# Patient Record
Sex: Male | Born: 1970 | Race: Black or African American | Hispanic: No | Smoking: Current every day smoker
Health system: Southern US, Community
[De-identification: ages and names within clinical notes are randomized; demographics above are authoritative.]

## PROBLEM LIST (undated history)

## (undated) DIAGNOSIS — I82409 Acute embolism and thrombosis of unspecified deep veins of unspecified lower extremity: Secondary | ICD-10-CM

---

## 2012-11-23 ENCOUNTER — Emergency Department (HOSPITAL_COMMUNITY)
Admission: EM | Admit: 2012-11-23 | Discharge: 2012-11-23 | Disposition: A | Discharge status: Home / Self Care | Payer: Self-pay | Attending: Emergency Medicine | Admitting: Emergency Medicine

## 2012-11-23 ENCOUNTER — Encounter (HOSPITAL_COMMUNITY): Payer: Self-pay | Admitting: *Deleted

## 2012-11-23 DIAGNOSIS — R509 Fever, unspecified: Secondary | ICD-10-CM | POA: Insufficient documentation

## 2012-11-23 DIAGNOSIS — IMO0001 Reserved for inherently not codable concepts without codable children: Secondary | ICD-10-CM | POA: Insufficient documentation

## 2012-11-23 DIAGNOSIS — F17200 Nicotine dependence, unspecified, uncomplicated: Secondary | ICD-10-CM | POA: Insufficient documentation

## 2012-11-23 DIAGNOSIS — R6889 Other general symptoms and signs: Secondary | ICD-10-CM

## 2012-11-23 MED ORDER — IBUPROFEN 800 MG PO TABS
800.0000 mg | ORAL_TABLET | Freq: Once | ORAL | Status: AC
  Administered 2012-11-23: 800 mg via ORAL
  Filled 2012-11-23: qty 1

## 2012-11-23 NOTE — ED Notes (Signed)
Per EMS: pt has flu like symptoms. Pt states that his symptoms started a few days ago with chills, body aches and pains. Pt unable to find ride to get tylenol, so pt has been unable to treat symptoms.

## 2012-11-24 NOTE — ED Provider Notes (Signed)
History     CSN: [id number]  Arrival date & time 11/23/12  2216   First MD Initiated Contact with Patient 11/23/12 2302      Chief Complaint  Patient presents with  . flu like symptoms     (Consider location/radiation/quality/duration/timing/severity/associated sxs/prior treatment) HPI 42 year old male presents to emergency room complaining of fever, chills, body aches and pain. Patient told triage symptoms started several days ago, reports to me they started earlier today. No known sick contacts, patient is a smoker no wheezing. He reports he got a flu shot 6 months ago, but his girlfriend denies this. He has not taken anything for it thus far. No nausea no vomiting no diarrhea.  History reviewed. No pertinent past medical history.  History reviewed. No pertinent past surgical history.  History reviewed. No pertinent family history.  History  Substance Use Topics  . Smoking status: Current Every Day Smoker  . Smokeless tobacco: Not on file  . Alcohol Use: No      Review of Systems  See History of Present Illness; otherwise all other systems are reviewed and negative  Allergies  Bee venom  Home Medications   Current Outpatient Rx  Name  Route  Sig  Dispense  Refill  . OVER THE COUNTER MEDICATION   Oral   Take 1 tablet by mouth every 4 (four) hours as needed. Cough and congestion (store brand cough and congestion medication)           BP 119/78  Pulse 118  Temp 100.4 F (38 C) (Oral)  Resp 20  SpO2 94%  Physical Exam  Nursing note and vitals reviewed. Constitutional: He is oriented to person, place, and time. He appears well-developed and well-nourished. He appears distressed (uncomfortable appearing, ).  HENT:  Head: Normocephalic and atraumatic.  Right Ear: External ear normal.  Left Ear: External ear normal.  Nose: Nose normal.  Mouth/Throat: Oropharynx is clear and moist.  Eyes: Conjunctivae normal and EOM are normal. Pupils are equal, round, and  reactive to light.  Neck: Normal range of motion. Neck supple. No JVD present. No tracheal deviation present. No thyromegaly present.  Cardiovascular: Regular rhythm, normal heart sounds and intact distal pulses.  Exam reveals no gallop and no friction rub.   No murmur heard.      Tachycardia noted  Pulmonary/Chest: Effort normal and breath sounds normal. No stridor. No respiratory distress. He has no wheezes. He has no rales. He exhibits no tenderness.  Abdominal: Soft. Bowel sounds are normal. He exhibits no distension and no mass. There is no tenderness. There is no rebound and no guarding.  Musculoskeletal: Normal range of motion. He exhibits no edema and no tenderness.  Lymphadenopathy:    He has no cervical adenopathy.  Neurological: He is alert and oriented to person, place, and time. He has normal reflexes. No cranial nerve deficit. He exhibits normal muscle tone. Coordination normal.  Skin: Skin is warm and dry. No rash noted. No erythema. No pallor.  Psychiatric: He has a normal mood and affect. His behavior is normal. Judgment and thought content normal.    ED Course  Procedures (including critical care time)  Labs Reviewed - No data to display No results found.   1. Flu-like symptoms       MDM  42 year old male with flulike illness. Suspect influenza. Explained to patient symptomatic care with Tylenol and Motrin fluids and rest as best treatment. Recommended flu shot next year.        Marian Sorrow  Kellie Simmering, MD 11/24/12 [phone]

## 2020-04-19 ENCOUNTER — Other Ambulatory Visit: Payer: Self-pay

## 2020-04-19 ENCOUNTER — Emergency Department (HOSPITAL_COMMUNITY): Payer: Self-pay

## 2020-04-19 ENCOUNTER — Encounter (HOSPITAL_COMMUNITY): Payer: Self-pay

## 2020-04-19 ENCOUNTER — Inpatient Hospital Stay (HOSPITAL_COMMUNITY)
Admission: EM | Admit: 2020-04-19 | Discharge: 2020-04-24 | DRG: 176 | Disposition: A | Discharge status: Home / Self Care | Payer: Self-pay | Attending: Internal Medicine | Admitting: Internal Medicine

## 2020-04-19 DIAGNOSIS — Z20822 Contact with and (suspected) exposure to covid-19: Secondary | ICD-10-CM | POA: Diagnosis present

## 2020-04-19 DIAGNOSIS — Z833 Family history of diabetes mellitus: Secondary | ICD-10-CM

## 2020-04-19 DIAGNOSIS — I82433 Acute embolism and thrombosis of popliteal vein, bilateral: Secondary | ICD-10-CM | POA: Diagnosis present

## 2020-04-19 DIAGNOSIS — F17200 Nicotine dependence, unspecified, uncomplicated: Secondary | ICD-10-CM | POA: Diagnosis present

## 2020-04-19 DIAGNOSIS — I82413 Acute embolism and thrombosis of femoral vein, bilateral: Secondary | ICD-10-CM | POA: Diagnosis present

## 2020-04-19 DIAGNOSIS — J45909 Unspecified asthma, uncomplicated: Secondary | ICD-10-CM

## 2020-04-19 DIAGNOSIS — Z79899 Other long term (current) drug therapy: Secondary | ICD-10-CM

## 2020-04-19 DIAGNOSIS — I2699 Other pulmonary embolism without acute cor pulmonale: Principal | ICD-10-CM

## 2020-04-19 DIAGNOSIS — I82463 Acute embolism and thrombosis of calf muscular vein, bilateral: Secondary | ICD-10-CM | POA: Diagnosis present

## 2020-04-19 DIAGNOSIS — I2602 Saddle embolus of pulmonary artery with acute cor pulmonale: Secondary | ICD-10-CM

## 2020-04-19 DIAGNOSIS — I272 Pulmonary hypertension, unspecified: Secondary | ICD-10-CM | POA: Diagnosis present

## 2020-04-19 DIAGNOSIS — I2609 Other pulmonary embolism with acute cor pulmonale: Secondary | ICD-10-CM

## 2020-04-19 LAB — CBC WITH DIFFERENTIAL/PLATELET
Abs Immature Granulocytes: 0.07 10*3/uL (ref 0.00–0.07)
Basophils Absolute: 0.1 10*3/uL (ref 0.0–0.1)
Basophils Relative: 1 %
Eosinophils Absolute: 0.3 10*3/uL (ref 0.0–0.5)
Eosinophils Relative: 3 %
HCT: 48 % (ref 39.0–52.0)
Hemoglobin: 15.5 g/dL (ref 13.0–17.0)
Immature Granulocytes: 1 %
Lymphocytes Relative: 16 %
Lymphs Abs: 2 10*3/uL (ref 0.7–4.0)
MCH: 28.7 pg (ref 26.0–34.0)
MCHC: 32.3 g/dL (ref 30.0–36.0)
MCV: 88.9 fL (ref 80.0–100.0)
Monocytes Absolute: 1.1 10*3/uL — ABNORMAL HIGH (ref 0.1–1.0)
Monocytes Relative: 8 %
Neutro Abs: 9.2 10*3/uL — ABNORMAL HIGH (ref 1.7–7.7)
Neutrophils Relative %: 71 %
Platelets: 229 10*3/uL (ref 150–400)
RBC: 5.4 MIL/uL (ref 4.22–5.81)
RDW: 15.1 % (ref 11.5–15.5)
WBC: 12.7 10*3/uL — ABNORMAL HIGH (ref 4.0–10.5)
nRBC: 0 % (ref 0.0–0.2)

## 2020-04-19 LAB — URINALYSIS, ROUTINE W REFLEX MICROSCOPIC
Bacteria, UA: NONE SEEN
Bilirubin Urine: NEGATIVE
Glucose, UA: NEGATIVE mg/dL
Ketones, ur: 20 mg/dL — AB
Leukocytes,Ua: NEGATIVE
Nitrite: NEGATIVE
Protein, ur: NEGATIVE mg/dL
Specific Gravity, Urine: >1.046 — ABNORMAL HIGH (ref 1.005–1.030)
pH: 5 (ref 5.0–8.0)

## 2020-04-19 LAB — COMPREHENSIVE METABOLIC PANEL
ALT: 12 U/L (ref 0–44)
AST: 16 U/L (ref 15–41)
Albumin: 3.3 g/dL — ABNORMAL LOW (ref 3.5–5.0)
Alkaline Phosphatase: 53 U/L (ref 38–126)
Anion gap: 8 (ref 5–15)
BUN: 11 mg/dL (ref 6–20)
CO2: 27 mmol/L (ref 22–32)
Calcium: 9.2 mg/dL (ref 8.9–10.3)
Chloride: 98 mmol/L (ref 98–111)
Creatinine, Ser: 0.97 mg/dL (ref 0.61–1.24)
GFR calc Af Amer: >60 mL/min (ref >60)
GFR calc non Af Amer: >60 mL/min (ref >60)
Glucose, Bld: 109 mg/dL — ABNORMAL HIGH (ref 70–99)
Potassium: 3.6 mmol/L (ref 3.5–5.1)
Sodium: 133 mmol/L — ABNORMAL LOW (ref 135–145)
Total Bilirubin: 0.6 mg/dL (ref 0.3–1.2)
Total Protein: 8.5 g/dL — ABNORMAL HIGH (ref 6.5–8.1)

## 2020-04-19 LAB — ECHOCARDIOGRAM COMPLETE
Height: 65 in
Weight: 2480 oz

## 2020-04-19 LAB — LACTIC ACID, PLASMA: Lactic Acid, Venous: 1.1 mmol/L (ref 0.5–1.9)

## 2020-04-19 LAB — APTT: aPTT: 35 seconds (ref 24–36)

## 2020-04-19 LAB — SARS CORONAVIRUS 2 BY RT PCR (HOSPITAL ORDER, PERFORMED IN ~~LOC~~ HOSPITAL LAB): SARS Coronavirus 2: NEGATIVE

## 2020-04-19 LAB — PROTIME-INR
INR: 1.2 (ref 0.8–1.2)
Prothrombin Time: 14.5 seconds (ref 11.4–15.2)

## 2020-04-19 LAB — BRAIN NATRIURETIC PEPTIDE: B Natriuretic Peptide: 14 pg/mL (ref 0.0–100.0)

## 2020-04-19 LAB — TROPONIN I (HIGH SENSITIVITY): Troponin I (High Sensitivity): 2 ng/L (ref <18)

## 2020-04-19 MED ORDER — ONDANSETRON HCL 4 MG/2ML IJ SOLN: 4.0000 mg | Freq: Four times a day (QID) | INTRAMUSCULAR | Status: DC | PRN

## 2020-04-19 MED ORDER — ALBUTEROL SULFATE (2.5 MG/3ML) 0.083% IN NEBU: 2.5000 mg | INHALATION_SOLUTION | RESPIRATORY_TRACT | Status: DC | PRN

## 2020-04-19 MED ORDER — HEPARIN BOLUS VIA INFUSION: 4200.0000 [IU] | Freq: Once | INTRAVENOUS | Status: DC

## 2020-04-19 MED ORDER — KETOROLAC TROMETHAMINE 30 MG/ML IJ SOLN
30.0000 mg | Freq: Once | INTRAMUSCULAR | Status: DC
  Filled 2020-04-19: qty 1

## 2020-04-19 MED ORDER — MORPHINE SULFATE (PF) 4 MG/ML IV SOLN
4.0000 mg | INTRAVENOUS | Status: DC | PRN
  Administered 2020-04-19 – 2020-04-21 (×7): 4 mg via INTRAVENOUS
  Filled 2020-04-19 (×7): qty 1

## 2020-04-19 MED ORDER — ACETAMINOPHEN 325 MG PO TABS: 650.0000 mg | ORAL_TABLET | Freq: Four times a day (QID) | ORAL | Status: DC | PRN

## 2020-04-19 MED ORDER — SODIUM CHLORIDE 0.9 % IV BOLUS
1000.0000 mL | Freq: Once | INTRAVENOUS | Status: AC
  Administered 2020-04-19: 1000 mL via INTRAVENOUS

## 2020-04-19 MED ORDER — IOHEXOL 350 MG/ML SOLN
100.0000 mL | Freq: Once | INTRAVENOUS | Status: AC | PRN
  Administered 2020-04-19: 100 mL via INTRAVENOUS

## 2020-04-19 MED ORDER — POLYETHYLENE GLYCOL 3350 17 G PO PACK: 17.0000 g | PACK | Freq: Every day | ORAL | Status: DC | PRN

## 2020-04-19 MED ORDER — HEPARIN BOLUS VIA INFUSION
5000.0000 [IU] | Freq: Once | INTRAVENOUS | Status: AC
  Administered 2020-04-19: 5000 [IU] via INTRAVENOUS

## 2020-04-19 MED ORDER — KETOROLAC TROMETHAMINE 30 MG/ML IJ SOLN
30.0000 mg | Freq: Once | INTRAMUSCULAR | Status: AC
  Administered 2020-04-19: 30 mg via INTRAVENOUS

## 2020-04-19 MED ORDER — LORAZEPAM 2 MG/ML IJ SOLN
1.0000 mg | Freq: Once | INTRAMUSCULAR | Status: AC
  Administered 2020-04-19: 1 mg via INTRAVENOUS
  Filled 2020-04-19: qty 1

## 2020-04-19 MED ORDER — MORPHINE SULFATE (PF) 4 MG/ML IV SOLN
4.0000 mg | Freq: Once | INTRAVENOUS | Status: AC
  Administered 2020-04-19: 4 mg via INTRAVENOUS
  Filled 2020-04-19: qty 1

## 2020-04-19 MED ORDER — POTASSIUM CHLORIDE CRYS ER 20 MEQ PO TBCR
40.0000 meq | EXTENDED_RELEASE_TABLET | Freq: Once | ORAL | Status: AC
  Administered 2020-04-19: 40 meq via ORAL
  Filled 2020-04-19: qty 2

## 2020-04-19 MED ORDER — HEPARIN (PORCINE) 25000 UT/250ML-% IV SOLN
1550.0000 [IU]/h | INTRAVENOUS | Status: DC
  Administered 2020-04-19: 1200 [IU]/h via INTRAVENOUS
  Administered 2020-04-20: 1600 [IU]/h via INTRAVENOUS
  Administered 2020-04-20: 1250 [IU]/h via INTRAVENOUS
  Administered 2020-04-21 – 2020-04-23 (×4): 1550 [IU]/h via INTRAVENOUS
  Filled 2020-04-19 (×6): qty 250

## 2020-04-19 MED ORDER — HEPARIN (PORCINE) 25000 UT/250ML-% IV SOLN
INTRAVENOUS | Status: AC
  Filled 2020-04-19: qty 250

## 2020-04-19 MED ORDER — SODIUM CHLORIDE 0.9 % IV SOLN: INTRAVENOUS | Status: DC

## 2020-04-19 MED ORDER — PANTOPRAZOLE SODIUM 40 MG PO TBEC
40.0000 mg | DELAYED_RELEASE_TABLET | Freq: Two times a day (BID) | ORAL | Status: DC
  Administered 2020-04-19 – 2020-04-24 (×10): 40 mg via ORAL
  Filled 2020-04-19 (×10): qty 1

## 2020-04-19 MED ORDER — ONDANSETRON HCL 4 MG/2ML IJ SOLN
4.0000 mg | Freq: Once | INTRAMUSCULAR | Status: AC
  Administered 2020-04-19: 4 mg via INTRAVENOUS
  Filled 2020-04-19: qty 2

## 2020-04-19 MED ORDER — ONDANSETRON HCL 4 MG PO TABS: 4.0000 mg | ORAL_TABLET | Freq: Four times a day (QID) | ORAL | Status: DC | PRN

## 2020-04-19 MED ORDER — ENSURE ENLIVE PO LIQD
237.0000 mL | Freq: Two times a day (BID) | ORAL | Status: DC
  Administered 2020-04-20: 237 mL via ORAL

## 2020-04-19 MED ORDER — ACETAMINOPHEN 650 MG RE SUPP: 650.0000 mg | Freq: Four times a day (QID) | RECTAL | Status: DC | PRN

## 2020-04-19 NOTE — ED Provider Notes (Addendum)
[facility] EMERGENCY DEPARTMENT Provider Note   CSN: [id number] Arrival date & time: 04/19/20  1030     History Chief Complaint  Patient presents with  . Abdominal Pain    Terrence Christian is a 49 y.o. male with no known past medical history presents to emergency department today with chief complaint of progressively worsening left-sided abdominal pain x2 days.  Patient states pain is sharp and severe.  He rates pain 10 out of 10 in severity.  Patient denies any radiation of pain.  He states his pain is constant.  He has not had any medications for symptoms prior to arrival.  He denies history of similar pain. Patient tells me he had spider bite on his left ankle x 2 weeks ago and was seen at an outside hospital and treated with PO antibiotics and reports wound improved. Denies any sick contacts or known Covid exposures.  Also denies fever, chills, cough, hemoptysis, chest pain, shortness of breath, nausea, emesis, dysuria, gross hematuria, urinary frequency, diarrhea, blood in stool.  Last bowel movement was yesterday and it was normal for him.  Denies abdominal surgical history. Admits to passing flatus today.    History reviewed. No pertinent past medical history.  There are no problems to display for this patient.   History reviewed. No pertinent surgical history.     No family history on file.  Social History   Tobacco Use  . Smoking status: Current Every Day Smoker  Substance Use Topics  . Alcohol use: No  . Drug use: No    Home Medications Prior to Admission medications   Medication Sig Start Date End Date Taking? Authorizing Provider  ATIVAN 0.5 MG tablet Take 1 tablet by mouth every 6 (six) hours as needed. 03/26/20   [provider]  OVER THE COUNTER MEDICATION Take 1 tablet by mouth every 4 (four) hours as needed. Cough and congestion (store brand cough and congestion medication)    [provider]  oxyCODONE-acetaminophen (PERCOCET/ROXICET) 5-325 MG  tablet Take 1 tablet by mouth every 6 (six) hours as needed. 03/22/20   [provider]    Allergies    Bee venom  Review of Systems   Review of Systems All other systems are reviewed and are negative for acute change except as noted in the HPI.  Physical Exam Updated Vital Signs BP (!) 130/96 (BP Location: Right Arm)   Pulse (!) 103   Temp 98.2 F (36.8 C) (Oral)   Resp (!) 26   Ht 5\' 5"  (1.651 m)   Wt 70.3 kg   SpO2 96%   BMI 25.79 kg/m   Physical Exam Vitals and nursing note reviewed.  Constitutional:      General: He is in acute distress.     Appearance: He is not ill-appearing.  HENT:     Head: Normocephalic and atraumatic.     Right Ear: Tympanic membrane and external ear normal.     Left Ear: Tympanic membrane and external ear normal.     Nose: Nose normal.     Mouth/Throat:     Mouth: Mucous membranes are moist.     Pharynx: Oropharynx is clear.  Eyes:     General: No scleral icterus.       Right eye: No discharge.        Left eye: No discharge.     Extraocular Movements: Extraocular movements intact.     Conjunctiva/sclera: Conjunctivae normal.     Pupils: Pupils are equal, round, and  reactive to light.  Neck:     Vascular: No JVD.  Cardiovascular:     Rate and Rhythm: Normal rate and regular rhythm.     Pulses: Normal pulses.          Radial pulses are 2+ on the right side and 2+ on the left side.     Heart sounds: Normal heart sounds.  Pulmonary:     Comments: Lungs clear to auscultation in all fields. Symmetric chest rise. No wheezing, rales, or rhonchi. Abdominal:     General: Bowel sounds are normal.     Palpations: There is no mass.     Tenderness: There is abdominal tenderness in the left upper quadrant and left lower quadrant. There is no right CVA tenderness or left CVA tenderness.     Hernia: No hernia is present.     Comments: Abdomen is soft, non distended. No rigidity, no guarding. No peritoneal signs.  Musculoskeletal:         General: Normal range of motion.     Cervical back: Normal range of motion.  Skin:    General: Skin is warm and dry.     Capillary Refill: Capillary refill takes less than 2 seconds.  Neurological:     Mental Status: He is oriented to person, place, and time.     GCS: GCS eye subscore is 4. GCS verbal subscore is 5. GCS motor subscore is 6.     Comments: Fluent speech, no facial droop.  Psychiatric:        Behavior: Behavior normal.     ED Results / Procedures / Treatments   Labs (all labs ordered are listed, but only abnormal results are displayed) Labs Reviewed  CBC WITH DIFFERENTIAL/PLATELET - Abnormal; Notable for the following components:      Result Value   WBC 12.7 (*)    Neutro Abs 9.2 (*)    Monocytes Absolute 1.1 (*)    All other components within normal limits  COMPREHENSIVE METABOLIC PANEL - Abnormal; Notable for the following components:   Sodium 133 (*)    Glucose, Bld 109 (*)    Total Protein 8.5 (*)    Albumin 3.3 (*)    All other components within normal limits  SARS CORONAVIRUS 2 BY RT PCR (HOSPITAL ORDER, PERFORMED IN [facility])  LACTIC ACID, PLASMA  PROTIME-INR  APTT  URINALYSIS, ROUTINE W REFLEX MICROSCOPIC  BRAIN NATRIURETIC PEPTIDE  TROPONIN I (HIGH SENSITIVITY)    EKG EKG Interpretation  Date/Time:  Wednesday April 19 2020 10:48:40 EDT Ventricular Rate:  97 PR Interval:    QRS Duration: 95 QT Interval:  375 QTC Calculation: 477 R Axis:   73 Text Interpretation: Sinus rhythm Borderline prolonged QT interval Confirmed by Bethann Berkshire ([id number]) on 04/19/2020 12:26:07 PM   Radiology CT Renal Stone Study  Result Date: 04/19/2020 CLINICAL DATA:  Left flank pain 1 day. EXAM: CT ABDOMEN AND PELVIS WITHOUT CONTRAST TECHNIQUE: Multidetector CT imaging of the abdomen and pelvis was performed following the standard protocol without IV contrast. COMPARISON:  None. FINDINGS: Lower chest: Small pleural effusions bilaterally. Mild bibasilar  patchy airspace disease. Heart size normal. Hepatobiliary: No focal liver abnormality is seen. No gallstones, gallbladder wall thickening, or biliary dilatation. Pancreas: Negative Spleen: Negative Adrenals/Urinary Tract: Adrenal glands are unremarkable. Kidneys are normal, without renal calculi, focal lesion, or hydronephrosis. Mild bladder wall thickening without mass or stone Stomach/Bowel: Mildly dilated small bowel loops in the epigastric region with air-fluid levels. Distal small bowel  and colon decompressed. Normal appendix. No bowel wall edema. Vascular/Lymphatic: Mild atherosclerotic calcification. No aortic aneurysm. No enlarged lymph nodes. Reproductive: Moderate to severe prostate enlargement measuring 5.8 x 5.0 x 5.0 cm. Other: No free fluid in the abdomen.  Negative for hernia Musculoskeletal: Mild degenerative changes in the lumbar spine. No acute skeletal abnormality. IMPRESSION: 1. No urinary tract calculi or hydronephrosis. 2. Dilated small bowel loops epigastric region with air-fluid levels. Probable partial small bowel obstruction. 3. Patchy bibasilar airspace disease which could represent atelectasis or pneumonia. Small bilateral pleural effusions. 4. Mild bladder wall thickening 5. Moderate to severe prostate enlargement. 6.  Aortic Atherosclerosis (ICD10-I70.0). Electronically Signed   By: Marlan Palau M.D.   On: 04/19/2020 13:36   CLINICAL DATA: Flank pain, airspace disease in lung bases on prior CT abdomen pelvis  EXAM: CT ANGIOGRAPHY CHEST WITH CONTRAST  TECHNIQUE: Multidetector CT imaging of the chest was performed using the standard protocol during bolus administration of intravenous contrast. Multiplanar CT image reconstructions and MIPs were obtained to evaluate the vascular anatomy.  CONTRAST: OMNIPAQUE IOHEXOL 350 MG/ML SOLN  COMPARISON: None.  FINDINGS: Cardiovascular: Satisfactory opacification of the pulmonary arteries to the segmental level. Examination  is generally limited by extensive breath motion artifact throughout. There is extensive bilateral pulmonary embolus in the distal left pulmonary artery as well as all lobar and many more distal segmental to subsegmental branches. Cardiomegaly with enlargement of the RV LV ratio, approximately 1.3. No pericardial effusion.  Mediastinum/Nodes: No enlarged mediastinal, hilar, or axillary lymph nodes. Thyroid gland, trachea, and esophagus demonstrate no significant findings.  Lungs/Pleura: Small bilateral pleural effusions associated atelectasis or consolidation. Foci of peripheral subpleural heterogeneous airspace opacity in the bilateral lower lobes, the largest foci of which demonstrate some evidence of central clearing, most notably in the anterior segment right lower lobe (series 6, image 87).  Upper Abdomen: No acute abnormality.  Musculoskeletal: No chest wall abnormality. No acute or significant osseous findings.  Review of the MIP images confirms the above findings.  IMPRESSION: 1. There is a large burden of extensive bilateral pulmonary embolus in the distal left pulmonary artery as well as all lobar and many more distal segmental to subsegmental branches. 2. Cardiomegaly with enlargement of the RV LV ratio, approximately 1.3, concerning for right heart strain. 3. Multiple foci of peripheral subpleural heterogeneous airspace opacity in the bilateral lower lobes, the largest foci of which demonstrate some evidence of central clearing, most notably in the anterior segment right lower lobe. Findings are consistent with developing bilateral pulmonary infarctions distal to occlusive embolus. 4. Small bilateral pleural effusions with associated atelectasis or consolidation.  These results were called by telephone at the time of interpretation on 04/19/2020 at 2:57 pm to provider PA Doug Sou , who verbally acknowledged these results.   Electronically Signed By: Lauralyn Primes M.D. On: 04/19/2020 15:01 Procedures .Critical Care Performed by: Sherene Sires, PA-C Authorized by: Sherene Sires, PA-C   Critical care provider statement:    Critical care time (minutes):  39   Critical care time was exclusive of:  Separately billable procedures and treating other patients and teaching time   Critical care was time spent personally by me on the following activities:  Development of treatment plan with patient or surrogate, discussions with consultants, evaluation of patient's response to treatment, examination of patient, obtaining history from patient or surrogate, ordering and performing treatments and interventions, ordering and review of laboratory studies, ordering and review of radiographic studies, pulse oximetry,  re-evaluation of patient's condition and review of old charts   I assumed direction of critical care for this patient from another provider in my specialty: no     (including critical care time)  Medications Ordered in ED Medications  heparin ADULT infusion 100 units/mL (25000 units/276mL sodium chloride 0.45%) (1,250 Units/hr Intravenous Rate/Dose Change 04/19/20 1552)  pantoprazole (PROTONIX) EC tablet 40 mg (has no administration in time range)  morphine 4 MG/ML injection 4 mg (4 mg Intravenous Given 04/19/20 1119)  ondansetron (ZOFRAN) injection 4 mg (4 mg Intravenous Given 04/19/20 1119)  sodium chloride 0.9 % bolus 1,000 mL (0 mLs Intravenous Stopped 04/19/20 1231)  LORazepam (ATIVAN) injection 1 mg (1 mg Intravenous Given 04/19/20 1134)  ketorolac (TORADOL) 30 MG/ML injection 30 mg (30 mg Intravenous Given 04/19/20 1231)  iohexol (OMNIPAQUE) 350 MG/ML injection 100 mL (100 mLs Intravenous Contrast Given 04/19/20 1413)  heparin bolus via infusion 5,000 Units (5,000 Units Intravenous Bolus from Bag 04/19/20 1550)    ED Course  I have reviewed the triage vital signs and the nursing notes.  Pertinent labs & imaging results that were available  during my care of the patient were reviewed by me and considered in my medical decision making (see chart for details).  Vitals:   04/19/20 1400 04/19/20 1430 04/19/20 1500 04/19/20 1530  BP: 128/84 (!) 147/91 (!) 125/91 (!) 132/97  Pulse:    (!) 104  Resp:      Temp:      TempSrc:      SpO2:    92%  Weight:      Height:          MDM Rules/Calculators/A&P                      History provided by patient with additional history obtained from chart review.    Patient seen and examined. Patient presents awake, alert, hemodynamically stable, afebrile.  Patient is tachycardic to 102 in triage. No hypoxia. On exam he appears to be in acute distress however does not appear septic.  He has significant tenderness to palpation of left upper and lower abdominal quadrants and left flank.  Normoactive bowel sounds.  His lungs are clear to auscultation all fields. Labs show leukocytosis of 12.7, no anemia, no severe electrolyte derangement, no renal insufficiency.  Lactic acid is within normal range.  UA not yet collected.  DDx includes kidney stone, ureteral colic, diverticulitis, gastritis, appendicitis, SBO, cholecystitis, biliary colic. EKG performed in triage shows sinus rhythm with borderline prolonged QT.  Patient given morphine for pain.  On reassessment he still quite uncomfortable.  Patient given Ativan and Toradol as kidney stone is high on the differential.  CT renal shows possible small bowel obstruction and possible pneumonia.  CTA chest then performed and shows lateral PE with right heart strain and pleural effusion and signs of pulmonary infarction.  Case discussed with radiologist who does not see findings consistent with SBO. Patient continues to deny any chest pain or respiratory symptoms on reassessment. This case was discussed with Dr. Roderic Palau who has seen the patient and agrees with plan to admit.  Paged critical care. Received call back from NP Eliseo Gum. Case discussed and she  will page attending. Pulmonologist Dr. Melvyn Novas evaluated patient at the beside. IV heparin started per pharmacy consult. Added on INR, BNP, troponin. Patient is stable at time of disposition.   Spoke with Dr. Arlyce Dice  with hospitalist service who agrees to assume care  of patient and bring into the hospital for further evaluation and management.  Patient will be admitted to ICU here at [facility]  Portions of this note were generated with Dragon dictation software. Dictation errors may occur despite best attempts at proofreading.   Final Clinical Impression(s) / ED Diagnoses Final diagnoses:  Pulmonary embolism and infarction [facility](HCC)    Rx / DC Orders ED Discharge Orders    None       Sherene Sireslbrizze, Magaline Steinberg E, PA-C 04/19/20 1600    Kathyrn Lasslbrizze, Rease Swinson E, PA-C 04/19/20 1626    Bethann BerkshireZammit, Joseph, MD 04/22/20 [id number]

## 2020-04-19 NOTE — Consult Note (Signed)
NAME:  Tarvares Lant, MRN:  [id number], DOB:  01-Oct-1971, LOS: 0 ADMISSION DATE:  04/19/2020, CONSULTATION DATE:  6/2 REFERRING MD:  Zammit/ ER, CHIEF COMPLAINT:  Massive PE    Brief History   48 yobm active smoker bitten by spider LLE several weeks PTA admit [facility] with resolution of leg symptoms then 2 days PTA noted L pleuritic cp and worsening sob > to er pm 6/2 with multiple PE so PCCM asked to consult re ? TPA needed.   History of present illness   Pt somnolent at time of eval p sedation pain meds so no independent hx available. Per EDP:   49 y.o. male with no known past medical history presents to emergency department today with chief complaint of progressively worsening left-sided abdominal pain x2 days.  Patient states pain is sharp and severe.  He rates pain 10 out of 10 in severity.  Patient denies any radiation of pain.  He states his pain is constant.  He has not had any medications for symptoms prior to arrival.  He denies history of similar pain.  Denies any sick contacts or known Covid exposures.  Also denies fever, chills, cough, hemoptysis, chest pain, shortness of breath, nausea, emesis, dysuria, gross hematuria, urinary frequency, diarrhea, blood in stool.  Last bowel movement was one day PTA  and it was normal for him.  Denies abdominal surgical history. Admits to passing flatus today  Past Medical History  No chronic dz know  Significant Hospital Events     Consults:  PCCM  Procedures:    Significant Diagnostic Tests:  CT a 6/2  Pos multiple PE  Micro Data:  Covid PCR  6/2 >>>  Antimicrobials:     Interim history/subjective:  Sedated on my arrival nad on RA   Objective   Blood pressure (!) 125/91, pulse (!) 103, temperature 98.2 F (36.8 C), temperature source Oral, resp. rate (!) 26, height 5\' 5"  (1.651 m), weight 70.3 kg, SpO2 96 %.       No intake or output data in the 24 hours ending 04/19/20 1549 Filed Weights   04/19/20 1040  Weight: 70.3 kg      Examination: General: unhealthy appearing bm sleeping on my arrival, arouses to verbal then back to sleep Lungs: clear bilterally Cardiovascular: RRR Pos increase in P2 Abdomen: soft/ benign Extremities: minimal increase L vs R calf but no cord, no cellulitis or obvious wound    I personally reviewed images and agree with radiology impression as follows:  Chest CTa:  Multiple large vessel PE's and small pleural effusions         Assessment & Plan:   Multiple PE without hypoxemia or significant hemodynamic compromise likely because he has nl baseline cardiopulmonary function - however, this is a life threatening PE and clot burden is quite large.  Rec: No role for TPA for now in any form, local or directed as risk > benefit statistically at this point.  Full dose IV Heparin until we see improvement - no early transition to [place] here/ discussed with pharmacy  Prophylactic PPI while on full dose hep  OK to feed but full bed rest until therapeutic on hep x 24 h to allow clots to stabilize and prevent additional break off clots for presumed LLE provoked DVT   >>> echo and venous dopplers ordered   Ok to admit to ICU here/ triad service.    Christinia Gully, MD Pulmonary and Lead [phone] After 6:00  PM or weekends, use Beeper [phone]  After 7:00 pm call Elink  [phone]      Labs   CBC: Recent Labs  Lab 04/19/20 1050  WBC 12.7*  NEUTROABS 9.2*  HGB 15.5  HCT 48.0  MCV 88.9  PLT 229    Basic Metabolic Panel: Recent Labs  Lab 04/19/20 1050  NA 133*  K 3.6  CL 98  CO2 27  GLUCOSE 109*  BUN 11  CREATININE 0.97  CALCIUM 9.2   GFR: Estimated Creatinine Clearance: 79.3 mL/min (by C-G formula based on SCr of 0.97 mg/dL). Recent Labs  Lab 04/19/20 1050 04/19/20 1058  WBC 12.7*  --   LATICACIDVEN  --  1.1    Liver Function Tests: Recent Labs  Lab 04/19/20 1050  AST 16  ALT 12  ALKPHOS 53   BILITOT 0.6  PROT 8.5*  ALBUMIN 3.3*   No results for input(s): LIPASE, AMYLASE in the last 168 hours. No results for input(s): AMMONIA in the last 168 hours.  ABG No results found for: PHART, PCO2ART, PO2ART, HCO3, TCO2, ACIDBASEDEF, O2SAT   Coagulation Profile: Recent Labs  Lab 04/19/20 1050  INR 1.2    Cardiac Enzymes: No results for input(s): CKTOTAL, CKMB, CKMBINDEX, TROPONINI in the last 168 hours.  HbA1C: No results found for: HGBA1C  CBG: No results for input(s): GLUCAP in the last 168 hours.     Past Medical History  He,  has no past medical history on file.   Surgical History   History reviewed. No pertinent surgical history.   Social History   reports that he has been smoking. He does not have any smokeless tobacco history on file. He reports that he does not drink alcohol or use drugs.   Family History   His family history is not on file.   Allergies Allergies  Allergen Reactions  . Bee Venom Anaphylaxis     Home Medications  Prior to Admission medications   Medication Sig Start Date End Date Taking? Authorizing Provider  ATIVAN 0.5 MG tablet Take 1 tablet by mouth every 6 (six) hours as needed. 03/26/20   [provider]  OVER THE COUNTER MEDICATION Take 1 tablet by mouth every 4 (four) hours as needed. Cough and congestion (store brand cough and congestion medication)    [provider]  oxyCODONE-acetaminophen (PERCOCET/ROXICET) 5-325 MG tablet Take 1 tablet by mouth every 6 (six) hours as needed. 03/22/20   [provider]       Sandrea Hughs, MD Pulmonary and Critical Care Medicine [facility] [phone] After 6:00 PM or weekends, use Beeper [phone]  After 7:00 pm call Elink  [phone]

## 2020-04-19 NOTE — ED Notes (Signed)
ED TO INPATIENT HANDOFF REPORT  ED Nurse Name and Phone #: Wilkie AyeKristy [phone]  S Name/Age/Gender Terrence Christian 49 y.o. male Room/Bed: APA05/APA05  Code Status   Code Status: Not on file  Home/SNF/Other Home Patient oriented to: situation Is this baseline? Yes   Triage Complete: Triage complete  Chief Complaint Pulmonary embolism (HCC) [I26.99]  Triage Note Pt c/o left sided abd pain x 2 days.  Denies any n/v/d, denies urinary symptoms.     Allergies Allergies  Allergen Reactions  . Bee Venom Anaphylaxis    Level of Care/Admitting Diagnosis ED Disposition    ED Disposition Condition Comment   Admit  [facility] [[id number]]  Level of Care: Stepdown [14]  Covid Evaluation: Confirmed COVID Negative  Diagnosis: Pulmonary embolism [facility](HCC) [241700]  Admitting Physician: Cresenciano LickEMOKPAE, EJIROGHENE E [[id number]]  Attending Physician: Onnie BoerEMOKPAE, EJIROGHENE E [phone][[id number]]  Estimated length of stay: past midnight tomorrow  Certification:: I certify this patient will need inpatient services for at least 2 midnights       B Medical/Surgery History History reviewed. No pertinent past medical history. History reviewed. No pertinent surgical history.   A IV Location/Drains/Wounds Patient Lines/Drains/Airways Status   Active Line/Drains/Airways    Name:   Placement date:   Placement time:   Site:   Days:   Peripheral IV 04/19/20 Left Antecubital   04/19/20    1057    Antecubital   less than 1          Intake/Output Last 24 hours No intake or output data in the 24 hours ending 04/19/20 1913  Labs/Imaging Results for orders placed or performed during the hospital encounter of 04/19/20 (from the past 48 hour(s))  CBC with Differential     Status: Abnormal   Collection Time: 04/19/20 10:50 AM  Result Value Ref Range   WBC 12.7 (H) 4.0 - 10.5 K/uL   RBC 5.40 4.22 - 5.81 MIL/uL   Hemoglobin 15.5 13.0 - 17.0 g/dL   HCT 98.148.0 19.139.0 - 47.852.0 %   MCV 88.9 80.0 - 100.0 fL   MCH  28.7 26.0 - 34.0 pg   MCHC 32.3 30.0 - 36.0 g/dL   RDW 29.515.1 62.111.5 - 30.815.5 %   Platelets 229 150 - 400 K/uL   nRBC 0.0 0.0 - 0.2 %   Neutrophils Relative % 71 %   Neutro Abs 9.2 (H) 1.7 - 7.7 K/uL   Lymphocytes Relative 16 %   Lymphs Abs 2.0 0.7 - 4.0 K/uL   Monocytes Relative 8 %   Monocytes Absolute 1.1 (H) 0.1 - 1.0 K/uL   Eosinophils Relative 3 %   Eosinophils Absolute 0.3 0.0 - 0.5 K/uL   Basophils Relative 1 %   Basophils Absolute 0.1 0.0 - 0.1 K/uL   Immature Granulocytes 1 %   Abs Immature Granulocytes 0.07 0.00 - 0.07 K/uL    Comment: Performed at [facility], [address] [place], [place] [address]  Comprehensive metabolic panel     Status: Abnormal   Collection Time: 04/19/20 10:50 AM  Result Value Ref Range   Sodium 133 (L) 135 - 145 mmol/L   Potassium 3.6 3.5 - 5.1 mmol/L   Chloride 98 98 - 111 mmol/L   CO2 27 22 - 32 mmol/L   Glucose, Bld 109 (H) 70 - 99 mg/dL    Comment: Glucose reference range applies only to samples taken after fasting for at least 8 hours.   BUN 11 6 - 20 mg/dL   Creatinine, Ser  0.97 0.61 - 1.24 mg/dL   Calcium 9.2 8.9 - 10.3 mg/dL   Total Protein 8.5 (H) 6.5 - 8.1 g/dL   Albumin 3.3 (L) 3.5 - 5.0 g/dL   AST 16 15 - 41 U/L   ALT 12 0 - 44 U/L   Alkaline Phosphatase 53 38 - 126 U/L   Total Bilirubin 0.6 0.3 - 1.2 mg/dL   GFR calc non Af Amer >60 >60 mL/min   GFR calc Af Amer >60 >60 mL/min   Anion gap 8 5 - 15    Comment: Performed at [facility], [address], [place], [place] [address]  Brain natriuretic peptide     Status: None   Collection Time: 04/19/20 10:50 AM  Result Value Ref Range   B Natriuretic Peptide 14.0 0.0 - 100.0 pg/mL    Comment: Performed at [facility], [address] [place], [place] [address]  Protime-INR     Status: None   Collection Time: 04/19/20 10:50 AM  Result Value Ref Range   Prothrombin Time 14.5 11.4 - 15.2 seconds   INR 1.2 0.8 - 1.2    Comment: (NOTE) INR goal varies based on device and disease  states. Performed at [facility], [address], [place], [place] [address]   APTT     Status: None   Collection Time: 04/19/20 10:50 AM  Result Value Ref Range   aPTT 35 24 - 36 seconds    Comment: Performed at [facility], [address], [place], [place] [address]  Lactic acid, plasma     Status: None   Collection Time: 04/19/20 10:58 AM  Result Value Ref Range   Lactic Acid, Venous 1.1 0.5 - 1.9 mmol/L    Comment: Performed at [facility], [address], [place], [place] [address]  SARS Coronavirus 2 by RT PCR (hospital order, performed in [facility] hospital lab) Nasopharyngeal Nasopharyngeal Swab     Status: None   Collection Time: 04/19/20  3:15 PM   Specimen: Nasopharyngeal Swab  Result Value Ref Range   SARS Coronavirus 2 NEGATIVE NEGATIVE    Comment: (NOTE) SARS-CoV-2 target nucleic acids are NOT DETECTED. The SARS-CoV-2 RNA is generally detectable in upper and lower respiratory specimens during the acute phase of infection. The lowest concentration of SARS-CoV-2 viral copies this assay can detect is 250 copies / mL. A negative result does not preclude SARS-CoV-2 infection and should not be used as the sole basis for treatment or other patient management decisions.  A negative result may occur with improper specimen collection / handling, submission of specimen other than nasopharyngeal swab, presence of viral mutation(s) within the areas targeted by this assay, and inadequate number of viral copies (<250 copies / mL). A negative result must be combined with clinical observations, patient history, and epidemiological information. Fact Sheet for Patients:   StrictlyIdeas.no Fact Sheet for Healthcare Providers: BankingDealers.co.za This test is not yet approved or cleared  by the [place] FDA and has been authorized for detection and/or diagnosis of SARS-CoV-2 by FDA under an Emergency Use Authorization (EUA).  This  EUA will remain in effect (meaning this test can be used) for the duration of the COVID-19 declaration under Section 564(b)(1) of the Act, 21 U.S.C. section 360bbb-3(b)(1), unless the authorization is terminated or revoked sooner. Performed at [facility], [address], [place], [place] [address]   Troponin I (High Sensitivity)     Status: None   Collection Time: 04/19/20  3:43 PM  Result Value Ref  Range   Troponin I (High Sensitivity) 2 <18 ng/L    Comment: (NOTE) Elevated high sensitivity troponin I (hsTnI) values and significant  changes across serial measurements may suggest ACS but many other  chronic and acute conditions are known to elevate hsTnI results.  Refer to the "Links" section for chest pain algorithms and additional  guidance. Performed at [facility], [address], [place], [place] [address]   Urinalysis, Routine w reflex microscopic     Status: Abnormal   Collection Time: 04/19/20  4:41 PM  Result Value Ref Range   Color, Urine YELLOW YELLOW   APPearance CLEAR CLEAR   Specific Gravity, Urine >1.046 (H) 1.005 - 1.030   pH 5.0 5.0 - 8.0   Glucose, UA NEGATIVE NEGATIVE mg/dL   Hgb urine dipstick SMALL (A) NEGATIVE   Bilirubin Urine NEGATIVE NEGATIVE   Ketones, ur 20 (A) NEGATIVE mg/dL   Protein, ur NEGATIVE NEGATIVE mg/dL   Nitrite NEGATIVE NEGATIVE   Leukocytes,Ua NEGATIVE NEGATIVE   RBC / HPF 0-5 0 - 5 RBC/hpf   WBC, UA 0-5 0 - 5 WBC/hpf   Bacteria, UA NONE SEEN NONE SEEN   Mucus PRESENT     Comment: Performed at [facility], [address], [place], [place] [address]   CT Angio Chest PE W and/or Wo Contrast  Result Date: 04/19/2020 CLINICAL DATA:  Flank pain, airspace disease in lung bases on prior CT abdomen pelvis EXAM: CT ANGIOGRAPHY CHEST WITH CONTRAST TECHNIQUE: Multidetector CT imaging of the chest was performed using the standard protocol during bolus administration of intravenous contrast. Multiplanar CT image reconstructions and MIPs were  obtained to evaluate the vascular anatomy. CONTRAST:  OMNIPAQUE IOHEXOL 350 MG/ML SOLN COMPARISON:  None. FINDINGS: Cardiovascular: Satisfactory opacification of the pulmonary arteries to the segmental level. Examination is generally limited by extensive breath motion artifact throughout. There is extensive bilateral pulmonary embolus in the distal left pulmonary artery as well as all lobar and many more distal segmental to subsegmental branches. Cardiomegaly with enlargement of the RV LV ratio, approximately 1.3. No pericardial effusion. Mediastinum/Nodes: No enlarged mediastinal, hilar, or axillary lymph nodes. Thyroid gland, trachea, and esophagus demonstrate no significant findings. Lungs/Pleura: Small bilateral pleural effusions associated atelectasis or consolidation. Foci of peripheral subpleural heterogeneous airspace opacity in the bilateral lower lobes, the largest foci of which demonstrate some evidence of central clearing, most notably in the anterior segment right lower lobe (series 6, image 87). Upper Abdomen: No acute abnormality. Musculoskeletal: No chest wall abnormality. No acute or significant osseous findings. Review of the MIP images confirms the above findings. IMPRESSION: 1. There is a large burden of extensive bilateral pulmonary embolus in the distal left pulmonary artery as well as all lobar and many more distal segmental to subsegmental branches. 2. Cardiomegaly with enlargement of the RV LV ratio, approximately 1.3, concerning for right heart strain. 3. Multiple foci of peripheral subpleural heterogeneous airspace opacity in the bilateral lower lobes, the largest foci of which demonstrate some evidence of central clearing, most notably in the anterior segment right lower lobe. Findings are consistent with developing bilateral pulmonary infarctions distal to occlusive embolus. 4. Small bilateral pleural effusions with associated atelectasis or consolidation. These results were called  by telephone at the time of interpretation on 04/19/2020 at 2:57 pm to provider PA Doug Sou , who verbally acknowledged these results. Electronically Signed   By: Lauralyn Primes M.D.   On: 04/19/2020 15:01   US Venous Img Lower Bilateral (DVT)  Result  Date: 04/19/2020 CLINICAL DATA:  49 year old with bilateral pulmonary emboli. EXAM: BILATERAL LOWER EXTREMITY VENOUS DOPPLER ULTRASOUND TECHNIQUE: Gray-scale sonography with graded compression, as well as color Doppler and duplex ultrasound were performed to evaluate the lower extremity deep venous systems from the level of the common femoral vein and including the common femoral, femoral, profunda femoral, popliteal and calf veins including the posterior tibial, peroneal and gastrocnemius veins when visible. The superficial great saphenous vein was also interrogated. Spectral Doppler was utilized to evaluate flow at rest and with distal augmentation maneuvers in the common femoral, femoral and popliteal veins. COMPARISON:  CT a chest 04/19/2020 FINDINGS: RIGHT LOWER EXTREMITY Common Femoral Vein: No evidence of thrombus. Normal compressibility, color Doppler flow and phasicity. Saphenofemoral Junction: No evidence of thrombus. Normal compressibility and flow on color Doppler imaging. Profunda Femoral Vein: No evidence of thrombus. Normal compressibility and flow on color Doppler imaging. Femoral Vein: Positive for thrombus. Right femoral vein is not compressible with echogenic thrombus. Evidence for occlusive thrombus in the right femoral vein. Popliteal Vein: Positive for thrombus. Popliteal vein is not compressible and contains echogenic thrombus. Occlusive thrombus in the right popliteal vein. Calf Veins: Positive for thrombus. Evidence for thrombus involving right posterior tibial and right peroneal veins. Probable thrombus in the right anterior tibial veins. Superficial Great Saphenous Vein: No evidence of thrombus. Normal compressibility. Other Findings:   None. LEFT LOWER EXTREMITY Common Femoral Vein: Positive for thrombus. Nonocclusive thrombus in left common femoral vein. Saphenofemoral Junction: Positive for thrombus. Evidence for thrombus at the left saphenofemoral junction. Profunda Femoral Vein: No evidence of thrombus. Normal color Doppler flow in the left profunda femoral vein. Femoral Vein: Positive for thrombus. Echogenic thrombus in left femoral vein. Occlusive thrombus in the mid and distal left femoral vein. Popliteal Vein: Positive for thrombus. Nonocclusive thrombus in left popliteal vein. Calf Veins: Positive for thrombus. Thrombus involving a left peroneal vein. Thrombus involving left anterior tibial veins. Superficial Great Saphenous Vein: No evidence of thrombus. Normal compressibility. Other Findings:  None. IMPRESSION: 1. Positive for deep venous thrombosis in the right lower extremity. Thrombus involving the right femoral vein, right popliteal vein and right deep calf veins. 2. Positive for deep venous thrombosis in the left lower extremity. Thrombus involving the left common femoral vein, left saphenofemoral junction, left femoral vein, left popliteal vein and left deep calf veins. Electronically Signed   By: Richarda Overlie M.D.   On: 04/19/2020 17:27   ECHOCARDIOGRAM COMPLETE  Result Date: 04/19/2020    ECHOCARDIOGRAM REPORT   Patient Name:   Terrence Christian Date of Exam: 04/19/2020 Medical Rec #:  [id number]    Height:       65.0 in Accession #:    [id number]   Weight:       155.0 lb Date of Birth:  10/31/1971   BSA:          1.775 m Patient Age:    48 years     BP:           150/112 mmHg Patient Gender: M            HR:           109 bpm. [facility] Procedure: 2D Echo Indications:    Pulmonary Embolus 415.19 / I26.99  History:        Patient has no prior history of Echocardiogram examinations.                 Risk Factors:Current Smoker.  Acute Pulmonary Embolism, Spider                 Bite, no known past medical history.   Sonographer:    Jeryl Columbia RDCS (AE) Referring Phys: [id number] MICHAEL B WERT IMPRESSIONS  1. Left ventricular ejection fraction, by estimation, is 70 to 75%. The left ventricle has hyperdynamic function. The left ventricle has no regional wall motion abnormalities. Left ventricular diastolic parameters are indeterminate.  2. There is ventricular septal flattening consistent with RV pressure overload. . Right ventricular systolic function is normal. The right ventricular size is mildly enlarged.  3. The mitral valve is normal in structure. No evidence of mitral valve regurgitation. No evidence of mitral stenosis.  4. The aortic valve is tricuspid. Aortic valve regurgitation is not visualized. No aortic stenosis is present.  5. Moderate to severe pulmonary HTN, PASP is 62 mmHg.  6. The inferior vena cava is normal in size with greater than 50% respiratory variability, suggesting right atrial pressure of 3 mmHg. FINDINGS  Left Ventricle: Left ventricular ejection fraction, by estimation, is 70 to 75%. The left ventricle has hyperdynamic function. The left ventricle has no regional wall motion abnormalities. The left ventricular internal cavity size was normal in size. There is no left ventricular hypertrophy. Left ventricular diastolic parameters are indeterminate. Right Ventricle: There is ventricular septal flattening consistent with RV pressure overload. The right ventricular size is mildly enlarged. Right vetricular wall thickness was not assessed. Right ventricular systolic function is normal. The tricuspid regurgitant velocity is 3.83 m/s, and with an assumed right atrial pressure of 10 mmHg, the estimated right ventricular systolic pressure is 68.7 mmHg. Left Atrium: Left atrial size was normal in size. Right Atrium: Right atrial size was normal in size. Pericardium: There is no evidence of pericardial effusion. Mitral Valve: The mitral valve is normal in structure. No evidence of mitral valve regurgitation. No  evidence of mitral valve stenosis. Tricuspid Valve: The tricuspid valve is normal in structure. Tricuspid valve regurgitation is mild . No evidence of tricuspid stenosis. Aortic Valve: The aortic valve is tricuspid. Aortic valve regurgitation is not visualized. No aortic stenosis is present. Aortic valve mean gradient measures 3.2 mmHg. Aortic valve peak gradient measures 7.7 mmHg. Aortic valve area, by VTI measures 2.96 cm. Pulmonic Valve: The pulmonic valve was not well visualized. Pulmonic valve regurgitation is not visualized. No evidence of pulmonic stenosis. Aorta: The aortic root is normal in size and structure. Pulmonary Artery: Moderate to severe pulmonary HTN, PASP is 62 mmHg. Venous: The inferior vena cava is normal in size with greater than 50% respiratory variability, suggesting right atrial pressure of 3 mmHg. IAS/Shunts: No atrial level shunt detected by color flow Doppler.  LEFT VENTRICLE PLAX 2D LVIDd:         4.08 cm  Diastology LVIDs:         2.07 cm  LV e' lateral:   8.05 cm/s LV PW:         0.96 cm  LV E/e' lateral: 7.8 LV IVS:        1.00 cm  LV e' medial:    6.85 cm/s LVOT diam:     2.00 cm  LV E/e' medial:  9.1 LV SV:         53 LV SV Index:   30 LVOT Area:     3.14 cm  RIGHT VENTRICLE RV S prime:     16.00 cm/s TAPSE (M-mode): 2.3 cm LEFT ATRIUM  Index       RIGHT ATRIUM           Index LA diam:      2.70 cm 1.52 cm/m  RA Area:     10.70 cm LA Vol (A2C): 13.0 ml 7.32 ml/m  RA Volume:   23.30 ml  13.13 ml/m LA Vol (A4C): 23.6 ml 13.30 ml/m  AORTIC VALVE AV Area (Vmax):    2.13 cm AV Area (Vmean):   2.00 cm AV Area (VTI):     2.96 cm AV Vmax:           138.75 cm/s AV Vmean:          81.412 cm/s AV VTI:            0.178 m AV Peak Grad:      7.7 mmHg AV Mean Grad:      3.2 mmHg LVOT Vmax:         94.10 cm/s LVOT Vmean:        51.764 cm/s LVOT VTI:          0.168 m LVOT/AV VTI ratio: 0.94  AORTA Ao Root diam: 2.90 cm MITRAL VALVE               TRICUSPID VALVE MV Area (PHT):  3.85 cm    TR Peak grad:   58.7 mmHg MV Decel Time: 197 msec    TR Vmax:        383.00 cm/s MV E velocity: 62.60 cm/s MV A velocity: 79.50 cm/s  SHUNTS MV E/A ratio:  0.79        Systemic VTI:  0.17 m                            Systemic Diam: 2.00 cm Dina Rich MD Electronically signed by Dina Rich MD Signature Date/Time: 04/19/2020/4:35:07 PM    Final    CT Renal Stone Study  Result Date: 04/19/2020 CLINICAL DATA:  Left flank pain 1 day. EXAM: CT ABDOMEN AND PELVIS WITHOUT CONTRAST TECHNIQUE: Multidetector CT imaging of the abdomen and pelvis was performed following the standard protocol without IV contrast. COMPARISON:  None. FINDINGS: Lower chest: Small pleural effusions bilaterally. Mild bibasilar patchy airspace disease. Heart size normal. Hepatobiliary: No focal liver abnormality is seen. No gallstones, gallbladder wall thickening, or biliary dilatation. Pancreas: Negative Spleen: Negative Adrenals/Urinary Tract: Adrenal glands are unremarkable. Kidneys are normal, without renal calculi, focal lesion, or hydronephrosis. Mild bladder wall thickening without mass or stone Stomach/Bowel: Mildly dilated small bowel loops in the epigastric region with air-fluid levels. Distal small bowel and colon decompressed. Normal appendix. No bowel wall edema. Vascular/Lymphatic: Mild atherosclerotic calcification. No aortic aneurysm. No enlarged lymph nodes. Reproductive: Moderate to severe prostate enlargement measuring 5.8 x 5.0 x 5.0 cm. Other: No free fluid in the abdomen.  Negative for hernia Musculoskeletal: Mild degenerative changes in the lumbar spine. No acute skeletal abnormality. IMPRESSION: 1. No urinary tract calculi or hydronephrosis. 2. Dilated small bowel loops epigastric region with air-fluid levels. Probable partial small bowel obstruction. 3. Patchy bibasilar airspace disease which could represent atelectasis or pneumonia. Small bilateral pleural effusions. 4. Mild bladder wall thickening 5.  Moderate to severe prostate enlargement. 6.  Aortic Atherosclerosis (ICD10-I70.0). Electronically Signed   By: Marlan Palau M.D.   On: 04/19/2020 13:36    Pending Labs [employer] (From admission, onward)    Start     Ordered   Signed and Held  HIV Antibody (routine testing w rflx)  (HIV Antibody (Routine testing w reflex) panel)  Once,   R     Signed and Held   Signed and Held  Basic metabolic panel  Tomorrow morning,   R     Signed and Held   Signed and Held  CBC  Tomorrow morning,   R     Signed and Held          Vitals/Pain Today's Vitals   04/19/20 1730 04/19/20 1800 04/19/20 1830 04/19/20 1900  BP: (!) 142/93 129/81 (!) 129/55 (!) 133/48  Pulse:      Resp:      Temp:      TempSrc:      SpO2:      Weight:      Height:      PainSc:        Isolation Precautions No active isolations  Medications Medications  heparin ADULT infusion 100 units/mL (25000 units/274mL sodium chloride 0.45%) (1,250 Units/hr Intravenous Rate/Dose Change 04/19/20 1552)  pantoprazole (PROTONIX) EC tablet 40 mg (40 mg Oral Given 04/19/20 1706)  morphine 4 MG/ML injection 4 mg (has no administration in time range)  morphine 4 MG/ML injection 4 mg (4 mg Intravenous Given 04/19/20 1119)  ondansetron (ZOFRAN) injection 4 mg (4 mg Intravenous Given 04/19/20 1119)  sodium chloride 0.9 % bolus 1,000 mL (0 mLs Intravenous Stopped 04/19/20 1231)  LORazepam (ATIVAN) injection 1 mg (1 mg Intravenous Given 04/19/20 1134)  ketorolac (TORADOL) 30 MG/ML injection 30 mg (30 mg Intravenous Given 04/19/20 1231)  iohexol (OMNIPAQUE) 350 MG/ML injection 100 mL (100 mLs Intravenous Contrast Given 04/19/20 1413)  heparin bolus via infusion 5,000 Units (5,000 Units Intravenous Bolus from Bag 04/19/20 1550)    Mobility walks Low fall risk   Focused Assessments Pulmonary Assessment Handoff:  Lung sounds:   O2 Device: Room Air        R Recommendations: See Admitting Provider Note  Report given to:   Additional  Notes:

## 2020-04-19 NOTE — ED Notes (Signed)
Echo in process 

## 2020-04-19 NOTE — H&P (Addendum)
History and Physical    Pike Scantlebury XKG:[id number] DOB: 05-04-1971 DOA: 04/19/2020  PCP: Patient, [name]   Patient coming from: Home  I have personally briefly reviewed patient's old medical records in [facility]  Chief Complaint: Left flank pain.  HPI: Terrence Christian is a 49 y.o. male with medical history significant for tobacco abuse, asthma.  Patient presented to the ED with complaints of left flank pain that started yesterday.  He initially denied difficulty breathing, but at the time of my evaluation he tells me his asthma is beginning to act up and he is having some mild difficulty breathing.  He denies chest pain.  No cough. 2 weeks ago, patient took a 10-hour drive to [place].  Without stopping or resting.  He had no complaints and went about his normal activities until yesterday when the left flank pain started.  No lower extremity redness swelling or pain.  About 2 weeks ago he was also bitten by a spider and was placed on antibiotics for this. No family history of blood clots in lungs or legs.  ED Course: Tachycardic to 107, mild intermittent tachypnea, O2 sats down to 91% at the time of my evaluation.  WBC 12.7. CT renal stone study-no calculi or hydronephrosis, probable partial small bowel obstruction, patchy bibasilar airspace disease atelectasis or pneumonia, small bilateral pleural effusions.  As patient looked very uncomfortable and with lung findings on CT, a CTA chest was done-tensive bilateral PE, with right heart strain.  Also findings suggestive of bilateral pulmonary infarctions distal to occlusive embolus. PCCM Dr. Sherene Sires was consulted, recommended IV heparin, okay to admit here, no role for TPA in any form. Echo venous Dopplers ordered.  Hospitalist to admit.  Review of Systems: As per HPI all other systems reviewed and negative.  History reviewed. No pertinent past medical history.  History reviewed. No pertinent surgical history.   reports that he has been  smoking. He does not have any smokeless tobacco history on file. He reports that he does not drink alcohol or use drugs.  Allergies  Allergen Reactions  . Bee Venom Anaphylaxis    Family History  Problem Relation Age of Onset  . Diabetes Brother     Prior to Admission medications   Medication Sig Start Date End Date Taking? Authorizing Provider  ATIVAN 0.5 MG tablet Take 1 tablet by mouth every 6 (six) hours as needed. 03/26/20   [provider]  OVER THE COUNTER MEDICATION Take 1 tablet by mouth every 4 (four) hours as needed. Cough and congestion (store brand cough and congestion medication)    [provider]  oxyCODONE-acetaminophen (PERCOCET/ROXICET) 5-325 MG tablet Take 1 tablet by mouth every 6 (six) hours as needed. 03/22/20   [provider]    Physical Exam: Vitals:   04/19/20 1430 04/19/20 1500 04/19/20 1530 04/19/20 1600  BP: (!) 147/91 (!) 125/91 (!) 132/97 (!) 150/112  Pulse:   (!) 104 (!) 107  Resp:      Temp:      TempSrc:      SpO2:   92% 92%  Weight:      Height:        Constitutional: NAD, calm, comfortable Vitals:   04/19/20 1430 04/19/20 1500 04/19/20 1530 04/19/20 1600  BP: (!) 147/91 (!) 125/91 (!) 132/97 (!) 150/112  Pulse:   (!) 104 (!) 107  Resp:      Temp:      TempSrc:      SpO2:  92% 92%  Weight:      Height:       Eyes: PERRL, lids and conjunctivae normal ENMT: Mucous membranes are moist. Posterior pharynx clear of any exudate or lesions. Neck: normal, supple, no masses, no thyromegaly Respiratory: clear to auscultation bilaterally, no wheezing, no crackles. Normal respiratory effort. No accessory muscle use.  Cardiovascular: Tachycardic, regular rate and rhythm, no murmurs / rubs / gallops. No extremity edema. 2+ pedal pulses.   Abdomen: no tenderness, no masses palpated. No hepatosplenomegaly. Bowel sounds positive.  Musculoskeletal: no clubbing / cyanosis. No joint deformity upper and lower extremities. Good  ROM, no contractures. Normal muscle tone.  Skin: no rashes, lesions, ulcers. No induration Neurologic: No apparent cranial nerve abnormality, moving all extremities spontaneously. Psychiatric: Normal judgment and insight. Alert and oriented x 3. Normal mood.   Labs on Admission: I have personally reviewed following labs and imaging studies  CBC: Recent Labs  Lab 04/19/20 1050  WBC 12.7*  NEUTROABS 9.2*  HGB 15.5  HCT 48.0  MCV 88.9  PLT 229   Basic Metabolic Panel: Recent Labs  Lab 04/19/20 1050  NA 133*  K 3.6  CL 98  CO2 27  GLUCOSE 109*  BUN 11  CREATININE 0.97  CALCIUM 9.2   Liver Function Tests: Recent Labs  Lab 04/19/20 1050  AST 16  ALT 12  ALKPHOS 53  BILITOT 0.6  PROT 8.5*  ALBUMIN 3.3*    Recent Labs  Lab 04/19/20 1050  INR 1.2    Radiological Exams on Admission: CT Angio Chest PE W and/or Wo Contrast  Result Date: 04/19/2020 CLINICAL DATA:  Flank pain, airspace disease in lung bases on prior CT abdomen pelvis EXAM: CT ANGIOGRAPHY CHEST WITH CONTRAST TECHNIQUE: Multidetector CT imaging of the chest was performed using the standard protocol during bolus administration of intravenous contrast. Multiplanar CT image reconstructions and MIPs were obtained to evaluate the vascular anatomy. CONTRAST:  OMNIPAQUE IOHEXOL 350 MG/ML SOLN COMPARISON:  None. FINDINGS: Cardiovascular: Satisfactory opacification of the pulmonary arteries to the segmental level. Examination is generally limited by extensive breath motion artifact throughout. There is extensive bilateral pulmonary embolus in the distal left pulmonary artery as well as all lobar and many more distal segmental to subsegmental branches. Cardiomegaly with enlargement of the RV LV ratio, approximately 1.3. No pericardial effusion. Mediastinum/Nodes: No enlarged mediastinal, hilar, or axillary lymph nodes. Thyroid gland, trachea, and esophagus demonstrate no significant findings. Lungs/Pleura: Small  bilateral pleural effusions associated atelectasis or consolidation. Foci of peripheral subpleural heterogeneous airspace opacity in the bilateral lower lobes, the largest foci of which demonstrate some evidence of central clearing, most notably in the anterior segment right lower lobe (series 6, image 87). Upper Abdomen: No acute abnormality. Musculoskeletal: No chest wall abnormality. No acute or significant osseous findings. Review of the MIP images confirms the above findings. IMPRESSION: 1. There is a large burden of extensive bilateral pulmonary embolus in the distal left pulmonary artery as well as all lobar and many more distal segmental to subsegmental branches. 2. Cardiomegaly with enlargement of the RV LV ratio, approximately 1.3, concerning for right heart strain. 3. Multiple foci of peripheral subpleural heterogeneous airspace opacity in the bilateral lower lobes, the largest foci of which demonstrate some evidence of central clearing, most notably in the anterior segment right lower lobe. Findings are consistent with developing bilateral pulmonary infarctions distal to occlusive embolus. 4. Small bilateral pleural effusions with associated atelectasis or consolidation. These results were called by telephone at the  time of interpretation on 04/19/2020 at 2:57 pm to provider PA Doug SouKAITLYN ALBRIZZE , who verbally acknowledged these results. Electronically Signed   By: Lauralyn PrimesAlex  Bibbey M.D.   On: 04/19/2020 15:01   ECHOCARDIOGRAM COMPLETE  Result Date: 04/19/2020    ECHOCARDIOGRAM REPORT   Patient Name:   Marcha SoldersYRONE Bucaro Date of Exam: 04/19/2020 Medical Rec #:  [id number]    Height:       65.0 in Accession #:    [id number]   Weight:       155.0 lb Date of Birth:  1971/01/29   BSA:          1.775 m Patient Age:    48 years     BP:           150/112 mmHg Patient Gender: M            HR:           109 bpm. [facility] Procedure: 2D Echo Indications:    Pulmonary Embolus 415.19 / I26.99  History:         Patient has no prior history of Echocardiogram examinations.                 Risk Factors:Current Smoker. Acute Pulmonary Embolism, Spider                 Bite, no known past medical history.  Sonographer:    Jeryl ColumbiaJohanna Elliott RDCS (AE) Referring Phys: [id number] MICHAEL B WERT IMPRESSIONS  1. Left ventricular ejection fraction, by estimation, is 70 to 75%. The left ventricle has hyperdynamic function. The left ventricle has no regional wall motion abnormalities. Left ventricular diastolic parameters are indeterminate.  2. There is ventricular septal flattening consistent with RV pressure overload. . Right ventricular systolic function is normal. The right ventricular size is mildly enlarged.  3. The mitral valve is normal in structure. No evidence of mitral valve regurgitation. No evidence of mitral stenosis.  4. The aortic valve is tricuspid. Aortic valve regurgitation is not visualized. No aortic stenosis is present.  5. Moderate to severe pulmonary HTN, PASP is 62 mmHg.  6. The inferior vena cava is normal in size with greater than 50% respiratory variability, suggesting right atrial pressure of 3 mmHg. FINDINGS  Left Ventricle: Left ventricular ejection fraction, by estimation, is 70 to 75%. The left ventricle has hyperdynamic function. The left ventricle has no regional wall motion abnormalities. The left ventricular internal cavity size was normal in size. There is no left ventricular hypertrophy. Left ventricular diastolic parameters are indeterminate. Right Ventricle: There is ventricular septal flattening consistent with RV pressure overload. The right ventricular size is mildly enlarged. Right vetricular wall thickness was not assessed. Right ventricular systolic function is normal. The tricuspid regurgitant velocity is 3.83 m/s, and with an assumed right atrial pressure of 10 mmHg, the estimated right ventricular systolic pressure is 68.7 mmHg. Left Atrium: Left atrial size was normal in size. Right Atrium:  Right atrial size was normal in size. Pericardium: There is no evidence of pericardial effusion. Mitral Valve: The mitral valve is normal in structure. No evidence of mitral valve regurgitation. No evidence of mitral valve stenosis. Tricuspid Valve: The tricuspid valve is normal in structure. Tricuspid valve regurgitation is mild . No evidence of tricuspid stenosis. Aortic Valve: The aortic valve is tricuspid. Aortic valve regurgitation is not visualized. No aortic stenosis is present. Aortic valve mean gradient measures 3.2 mmHg. Aortic valve peak gradient measures 7.7 mmHg. Aortic valve  area, by VTI measures 2.96 cm. Pulmonic Valve: The pulmonic valve was not well visualized. Pulmonic valve regurgitation is not visualized. No evidence of pulmonic stenosis. Aorta: The aortic root is normal in size and structure. Pulmonary Artery: Moderate to severe pulmonary HTN, PASP is 62 mmHg. Venous: The inferior vena cava is normal in size with greater than 50% respiratory variability, suggesting right atrial pressure of 3 mmHg. IAS/Shunts: No atrial level shunt detected by color flow Doppler.  LEFT VENTRICLE PLAX 2D LVIDd:         4.08 cm  Diastology LVIDs:         2.07 cm  LV e' lateral:   8.05 cm/s LV PW:         0.96 cm  LV E/e' lateral: 7.8 LV IVS:        1.00 cm  LV e' medial:    6.85 cm/s LVOT diam:     2.00 cm  LV E/e' medial:  9.1 LV SV:         53 LV SV Index:   30 LVOT Area:     3.14 cm  RIGHT VENTRICLE RV S prime:     16.00 cm/s TAPSE (M-mode): 2.3 cm LEFT ATRIUM           Index       RIGHT ATRIUM           Index LA diam:      2.70 cm 1.52 cm/m  RA Area:     10.70 cm LA Vol (A2C): 13.0 ml 7.32 ml/m  RA Volume:   23.30 ml  13.13 ml/m LA Vol (A4C): 23.6 ml 13.30 ml/m  AORTIC VALVE AV Area (Vmax):    2.13 cm AV Area (Vmean):   2.00 cm AV Area (VTI):     2.96 cm AV Vmax:           138.75 cm/s AV Vmean:          81.412 cm/s AV VTI:            0.178 m AV Peak Grad:      7.7 mmHg AV Mean Grad:      3.2 mmHg LVOT  Vmax:         94.10 cm/s LVOT Vmean:        51.764 cm/s LVOT VTI:          0.168 m LVOT/AV VTI ratio: 0.94  AORTA Ao Root diam: 2.90 cm MITRAL VALVE               TRICUSPID VALVE MV Area (PHT): 3.85 cm    TR Peak grad:   58.7 mmHg MV Decel Time: 197 msec    TR Vmax:        383.00 cm/s MV E velocity: 62.60 cm/s MV A velocity: 79.50 cm/s  SHUNTS MV E/A ratio:  0.79        Systemic VTI:  0.17 m                            Systemic Diam: 2.00 cm Carlyle Dolly MD Electronically signed by Carlyle Dolly MD Signature Date/Time: 04/19/2020/4:35:07 PM    Final    CT Renal Stone Study  Result Date: 04/19/2020 CLINICAL DATA:  Left flank pain 1 day. EXAM: CT ABDOMEN AND PELVIS WITHOUT CONTRAST TECHNIQUE: Multidetector CT imaging of the abdomen and pelvis was performed following the standard protocol without IV contrast. COMPARISON:  None. FINDINGS: Lower chest:  Small pleural effusions bilaterally. Mild bibasilar patchy airspace disease. Heart size normal. Hepatobiliary: No focal liver abnormality is seen. No gallstones, gallbladder wall thickening, or biliary dilatation. Pancreas: Negative Spleen: Negative Adrenals/Urinary Tract: Adrenal glands are unremarkable. Kidneys are normal, without renal calculi, focal lesion, or hydronephrosis. Mild bladder wall thickening without mass or stone Stomach/Bowel: Mildly dilated small bowel loops in the epigastric region with air-fluid levels. Distal small bowel and colon decompressed. Normal appendix. No bowel wall edema. Vascular/Lymphatic: Mild atherosclerotic calcification. No aortic aneurysm. No enlarged lymph nodes. Reproductive: Moderate to severe prostate enlargement measuring 5.8 x 5.0 x 5.0 cm. Other: No free fluid in the abdomen.  Negative for hernia Musculoskeletal: Mild degenerative changes in the lumbar spine. No acute skeletal abnormality. IMPRESSION: 1. No urinary tract calculi or hydronephrosis. 2. Dilated small bowel loops epigastric region with air-fluid levels.  Probable partial small bowel obstruction. 3. Patchy bibasilar airspace disease which could represent atelectasis or pneumonia. Small bilateral pleural effusions. 4. Mild bladder wall thickening 5. Moderate to severe prostate enlargement. 6.  Aortic Atherosclerosis (ICD10-I70.0). Electronically Signed   By: Marlan Palau M.D.   On: 04/19/2020 13:36    EKG: Independently reviewed.  Sinus rhythm rate 97, QTc 477.  No significant ST or T wave abnormalities.  No old EKG to compare.  Assessment/Plan Principal Problem:   Acute pulmonary embolism (HCC) Active Problems:   Asthma   Acute pulmonary embolism-likely provoked by 10hr Drive to [place] 2 weeks ago, both without significant symptoms until yesterday.  Today has mild dyspnea.  O2 sats now down to 91% on room air.  Tachycardic.  CTA shows extensive bilateral PE with right heart strain, also suggestive of developing bilateral pulmonary infarctions. -PCCM consulted in ED, evaluated by Dr. Sherene Sires, no role for TPA in any form, IV heparin until we see improvement, no early transition to DOAC, PPI, full bed rest until therapeutic on heparin for 24 hours to allow close to stabilize , echo and venous Dopplers ordered -Supplemental O2 -IV morphine 4mg  for flank pain -Echocardiogram-EF 70 to 75%, indeterminate LV diastolic parameters, due to severe pulmonary hypertension, PASP 62. -Venous Dopplers bilateral-positive for DVT in both right and left lower extremity, involvement of multiple veins.  - 1 L bolus given, continue N/s 100cc/hr x 15hrs  History of asthma-stable no wheezing. -Albuterol inhaler as needed  Tobacco abuse  DVT prophylaxis: Heparin Code Status: Full code Family Communication: None at bedside Disposition Plan: > 2 days Consults called: PCCM Admission status: Inpatient, stepdown I certify that at the point of admission it is my clinical judgment that the patient will require inpatient hospital care spanning beyond 2 midnights from the  point of admission due to high intensity of service, high risk for further deterioration and high frequency of surveillance required. The following factors support the patient status of inpatient: PE with right heart strain   MD [facility]  04/19/2020, 5:56 PM

## 2020-04-19 NOTE — Progress Notes (Addendum)
ANTICOAGULATION CONSULT NOTE - Initial Consult  Pharmacy Consult for heparin IV Indication: pulmonary embolus  Allergies  Allergen Reactions  . Bee Venom Anaphylaxis    Patient Measurements: Height: 5\' 5"  (165.1 cm) Weight: 70.3 kg (155 lb) IBW/kg (Calculated) : 61.5 Heparin Dosing Weight: 70.3 kg  Vital Signs: Temp: 98.2 F (36.8 C) (06/02 1039) Temp Source: Oral (06/02 1039) BP: 125/91 (06/02 1500) Pulse Rate: 103 (06/02 1115)  Labs: Recent Labs    04/19/20 1050  HGB 15.5  HCT 48.0  PLT 229  APTT 35  LABPROT 14.5  INR 1.2  CREATININE 0.97    Estimated Creatinine Clearance: 79.3 mL/min (by C-G formula based on SCr of 0.97 mg/dL).   Medical History: History reviewed. No pertinent past medical history.  Medications:  (Not in a hospital admission)   Assessment: Pharmacy consulted to dose heparin in patient with pulmonary embolism.  Patient is not on anticoagulation prior to admission.  Goal of Therapy:  Heparin level 0.3-0.7 units/ml Monitor platelets by anticoagulation protocol: Yes   Plan:  Give 5000 units bolus x 1  Start heparin infusion at 1250 units/hr Check anti-Xa level in 6 hours and daily while on heparin. Continue to monitor H&H   06/19/20, PharmD Clinical Pharmacist 04/19/2020 3:40 PM

## 2020-04-19 NOTE — ED Triage Notes (Signed)
Pt c/o left sided abd pain x 2 days.  Denies any n/v/d, denies urinary symptoms.

## 2020-04-19 NOTE — ED Notes (Signed)
Pt continues to moan out in pain . PA notified

## 2020-04-19 NOTE — Progress Notes (Signed)
*  PRELIMINARY RESULTS* Echocardiogram 2D Echocardiogram has been performed.  Terrence Christian 04/19/2020, 4:28 PM

## 2020-04-20 LAB — CBC
HCT: 39.9 % (ref 39.0–52.0)
HCT: 39.9 % (ref 39.0–52.0)
Hemoglobin: 13.1 g/dL (ref 13.0–17.0)
Hemoglobin: 13.1 g/dL (ref 13.0–17.0)
MCH: 28.9 pg (ref 26.0–34.0)
MCH: 29.1 pg (ref 26.0–34.0)
MCHC: 32.8 g/dL (ref 30.0–36.0)
MCHC: 32.8 g/dL (ref 30.0–36.0)
MCV: 87.9 fL (ref 80.0–100.0)
MCV: 88.7 fL (ref 80.0–100.0)
Platelets: 226 10*3/uL (ref 150–400)
Platelets: 201 10*3/uL (ref 150–400)
RBC: 4.54 MIL/uL (ref 4.22–5.81)
RBC: 4.5 MIL/uL (ref 4.22–5.81)
RDW: 14.9 % (ref 11.5–15.5)
RDW: 15.3 % (ref 11.5–15.5)
WBC: 11.8 10*3/uL — ABNORMAL HIGH (ref 4.0–10.5)
WBC: 11.1 10*3/uL — ABNORMAL HIGH (ref 4.0–10.5)
nRBC: 0 % (ref 0.0–0.2)
nRBC: 0 % (ref 0.0–0.2)

## 2020-04-20 LAB — BASIC METABOLIC PANEL
Anion gap: 6 (ref 5–15)
BUN: 9 mg/dL (ref 6–20)
CO2: 25 mmol/L (ref 22–32)
Calcium: 8.6 mg/dL — ABNORMAL LOW (ref 8.9–10.3)
Chloride: 102 mmol/L (ref 98–111)
Creatinine, Ser: 0.88 mg/dL (ref 0.61–1.24)
GFR calc Af Amer: >60 mL/min (ref >60)
GFR calc non Af Amer: >60 mL/min (ref >60)
Glucose, Bld: 94 mg/dL (ref 70–99)
Potassium: 4 mmol/L (ref 3.5–5.1)
Sodium: 133 mmol/L — ABNORMAL LOW (ref 135–145)

## 2020-04-20 LAB — HEPARIN LEVEL (UNFRACTIONATED)
Heparin Unfractionated: 0.21 IU/mL — ABNORMAL LOW (ref 0.30–0.70)
Heparin Unfractionated: 0.35 IU/mL (ref 0.30–0.70)
Heparin Unfractionated: 0.66 IU/mL (ref 0.30–0.70)

## 2020-04-20 LAB — HIV ANTIBODY (ROUTINE TESTING W REFLEX): HIV Screen 4th Generation wRfx: NONREACTIVE

## 2020-04-20 LAB — MRSA PCR SCREENING: MRSA by PCR: NEGATIVE

## 2020-04-20 MED ORDER — NICOTINE 7 MG/24HR TD PT24
7.0000 mg | MEDICATED_PATCH | Freq: Every day | TRANSDERMAL | Status: DC
  Administered 2020-04-20 – 2020-04-24 (×5): 7 mg via TRANSDERMAL
  Filled 2020-04-20 (×5): qty 1

## 2020-04-20 MED ORDER — CHLORHEXIDINE GLUCONATE CLOTH 2 % EX PADS
6.0000 | MEDICATED_PAD | Freq: Every day | CUTANEOUS | Status: DC
  Administered 2020-04-20 – 2020-04-24 (×5): 6 via TOPICAL

## 2020-04-20 MED ORDER — ORAL CARE MOUTH RINSE
15.0000 mL | Freq: Two times a day (BID) | OROMUCOSAL | Status: DC
  Administered 2020-04-20 – 2020-04-24 (×9): 15 mL via OROMUCOSAL

## 2020-04-20 MED ORDER — HEPARIN BOLUS VIA INFUSION
2000.0000 [IU] | Freq: Once | INTRAVENOUS | Status: AC
  Administered 2020-04-20: 2000 [IU] via INTRAVENOUS
  Filled 2020-04-20: qty 2000

## 2020-04-20 MED ORDER — ENSURE ENLIVE PO LIQD
237.0000 mL | Freq: Three times a day (TID) | ORAL | Status: DC
  Administered 2020-04-22 – 2020-04-24 (×3): 237 mL via ORAL

## 2020-04-20 MED ORDER — HEPARIN BOLUS VIA INFUSION
1000.0000 [IU] | Freq: Once | INTRAVENOUS | Status: AC
  Administered 2020-04-20: 1000 [IU] via INTRAVENOUS
  Filled 2020-04-20: qty 1000

## 2020-04-20 NOTE — Progress Notes (Signed)
Dr. Sherryll Burger paged to see if patient could be ordered a nicotine patch. Waiting for call back/orders. Will Continue to monitor pt

## 2020-04-20 NOTE — Progress Notes (Signed)
PROGRESS NOTE    Terrence Christian  BTD:[id number] DOB: Feb 01, 1971 DOA: 04/19/2020 PCP: Patient, [name]   Brief Narrative:  Per HPI: Terrence Christian is a 49 y.o. male with medical history significant for tobacco abuse, asthma.  Patient presented to the ED with complaints of left flank pain that started yesterday.  He initially denied difficulty breathing, but at the time of my evaluation he tells me his asthma is beginning to act up and he is having some mild difficulty breathing.  He denies chest pain.  No cough. 2 weeks ago, patient took a 10-hour drive to [place].  Without stopping or resting.  He had no complaints and went about his normal activities until yesterday when the left flank pain started.  No lower extremity redness swelling or pain.  About 2 weeks ago he was also bitten by a spider and was placed on antibiotics for this. No family history of blood clots in lungs or legs.  -Patient noted to have extensive bilateral acute pulmonary embolism along with bilateral DVT noted.  2D echocardiogram with LVEF 70-75% and indeterminate LV diastolic parameters.  PASP 62.  He has been seen by pulmonology with recommendations to remain on IV heparin for now.  He remains on nasal cannula oxygen with stable vital signs currently noted.  Assessment & Plan:   Principal Problem:   Acute pulmonary embolism (HCC) Active Problems:   Asthma   Acute bilateral PE along with bilateral DVT -Continue on IV heparin drip per pulmonology recommendations -Full bedrest for now -2D echocardiogram with results as noted below with LVEF 70-75% and PASP 62, stable vital signs noted -Continues to have flank pain for which I will prescribe pain medications as needed -Continue supplemental oxygen  History of asthma -Currently without any bronchospasms -Albuterol inhaler as needed  History of tobacco abuse -Counseled on cessation -Nicotine patch ordered  DVT prophylaxis: Heparin drip Code Status: Full  code Family Communication: Discussed with brother on phone 6/3 Disposition Plan: Continue on heparin drip with pulmonology recommendations pending. Status is: Inpatient  Remains inpatient appropriate because:IV treatments appropriate due to intensity of illness or inability to take PO and Inpatient level of care appropriate due to severity of illness   Dispo: The patient is from: Home              Anticipated d/c is to: Home              Anticipated d/c date is: 3 days              Patient currently is not medically stable to d/c.  He continues to require IV heparin due to his clot burden with further recommendations from pulmonology still pending.  He is not stable for discharge.   Consultants:   PCCM  Procedures:   See below  Antimicrobials:  Anti-infectives (From admission, onward)   None       Subjective: Patient seen and evaluated today with some ongoing flank pain noted.  He denies any chest pain, cough, hemoptysis, or pain in his legs.  He appears to think that the swelling of his legs have come down.  Objective: Vitals:   04/20/20 0800 04/20/20 0919 04/20/20 1000 04/20/20 1100  BP: 112/83 109/70 115/68 119/76  Pulse: 86 94 92 88  Resp: 18 20 (!) 27   Temp:      TempSrc:      SpO2: 99% 99% 96% 99%  Weight:      Height:  Intake/Output Summary (Last 24 hours) at 04/20/2020 1128 Last data filed at 04/20/2020 1114 Gross per 24 hour  Intake 1254.48 ml  Output 1200 ml  Net 54.48 ml   Filed Weights   04/19/20 1040 04/19/20 2148  Weight: 70.3 kg 70.7 kg    Examination:  General exam: Appears calm and comfortable  Respiratory system: Clear to auscultation. Respiratory effort normal.  Currently on 2 L nasal cannula oxygen. Cardiovascular system: S1 & S2 heard, RRR. No JVD, murmurs, rubs, gallops or clicks. No pedal edema. Gastrointestinal system: Abdomen is nondistended, soft and nontender. No organomegaly or masses felt. Normal bowel sounds  heard. Central nervous system: Alert and oriented. No focal neurological deficits. Extremities: Symmetric 5 x 5 power. Skin: No rashes, lesions or ulcers Psychiatry: Judgement and insight appear normal. Mood & affect appropriate.     Data Reviewed: I have personally reviewed following labs and imaging studies  CBC: Recent Labs  Lab 04/19/20 1050 04/20/20 0437 04/20/20 0810  WBC 12.7* 11.8* 11.1*  NEUTROABS 9.2*  --   --   HGB 15.5 13.1 13.1  HCT 48.0 39.9 39.9  MCV 88.9 87.9 88.7  PLT 229 226 201   Basic Metabolic Panel: Recent Labs  Lab 04/19/20 1050 04/20/20 0437  NA 133* 133*  K 3.6 4.0  CL 98 102  CO2 27 25  GLUCOSE 109* 94  BUN 11 9  CREATININE 0.97 0.88  CALCIUM 9.2 8.6*   GFR: Estimated Creatinine Clearance: 87.4 mL/min (by C-G formula based on SCr of 0.88 mg/dL). Liver Function Tests: Recent Labs  Lab 04/19/20 1050  AST 16  ALT 12  ALKPHOS 53  BILITOT 0.6  PROT 8.5*  ALBUMIN 3.3*   No results for input(s): LIPASE, AMYLASE in the last 168 hours. No results for input(s): AMMONIA in the last 168 hours. Coagulation Profile: Recent Labs  Lab 04/19/20 1050  INR 1.2   Cardiac Enzymes: No results for input(s): CKTOTAL, CKMB, CKMBINDEX, TROPONINI in the last 168 hours. BNP (last 3 results) No results for input(s): PROBNP in the last 8760 hours. HbA1C: No results for input(s): HGBA1C in the last 72 hours. CBG: No results for input(s): GLUCAP in the last 168 hours. Lipid Profile: No results for input(s): CHOL, HDL, LDLCALC, TRIG, CHOLHDL, LDLDIRECT in the last 72 hours. Thyroid Function Tests: No results for input(s): TSH, T4TOTAL, FREET4, T3FREE, THYROIDAB in the last 72 hours. Anemia Panel: No results for input(s): VITAMINB12, FOLATE, FERRITIN, TIBC, IRON, RETICCTPCT in the last 72 hours. Sepsis Labs: Recent Labs  Lab 04/19/20 1058  LATICACIDVEN 1.1    Recent Results (from the past 240 hour(s))  SARS Coronavirus 2 by RT PCR (hospital  order, [facility]) Nasopharyngeal Nasopharyngeal Swab     Status: None   Collection Time: 04/19/20  3:15 PM   Specimen: Nasopharyngeal Swab  Result Value Ref Range Status   SARS Coronavirus 2 NEGATIVE NEGATIVE Final    Comment: (NOTE) SARS-CoV-2 target nucleic acids are NOT DETECTED. The SARS-CoV-2 RNA is generally detectable in upper and lower respiratory specimens during the acute phase of infection. The lowest concentration of SARS-CoV-2 viral copies this assay can detect is 250 copies / mL. A negative result does not preclude SARS-CoV-2 infection and should not be used as the sole basis for treatment or other patient management decisions.  A negative result may occur with improper specimen collection / handling, submission of specimen other than nasopharyngeal swab, presence of viral mutation(s) within the areas targeted by  this assay, and inadequate number of viral copies (<250 copies / mL). A negative result must be combined with clinical observations, patient history, and epidemiological information. Fact Sheet for Patients:   BoilerBrush.com.cy Fact Sheet for Healthcare Providers: https://pope.com/ This test is not yet approved or cleared  by the [place] FDA and has been authorized for detection and/or diagnosis of SARS-CoV-2 by FDA under an Emergency Use Authorization (EUA).  This EUA will remain in effect (meaning this test can be used) for the duration of the COVID-19 declaration under Section 564(b)(1) of the Act, 21 U.S.C. section 360bbb-3(b)(1), unless the authorization is terminated or revoked sooner. Performed at [facility], [address], [place], [place] [address]   MRSA PCR Screening     Status: None   Collection Time: 04/19/20  9:36 PM   Specimen: Nasal Mucosa; Nasopharyngeal  Result Value Ref Range Status   MRSA by PCR NEGATIVE NEGATIVE Final    Comment:        The GeneXpert  MRSA Assay (FDA approved for NASAL specimens only), is one component of a comprehensive MRSA colonization surveillance program. It is not intended to diagnose MRSA infection nor to guide or monitor treatment for MRSA infections. Performed at [facility], [address] [place], [place] [address]          Radiology Studies: CT Angio Chest PE W and/or Wo Contrast  Result Date: 04/19/2020 CLINICAL DATA:  Flank pain, airspace disease in lung bases on prior CT abdomen pelvis EXAM: CT ANGIOGRAPHY CHEST WITH CONTRAST TECHNIQUE: Multidetector CT imaging of the chest was performed using the standard protocol during bolus administration of intravenous contrast. Multiplanar CT image reconstructions and MIPs were obtained to evaluate the vascular anatomy. CONTRAST:  OMNIPAQUE IOHEXOL 350 MG/ML SOLN COMPARISON:  None. FINDINGS: Cardiovascular: Satisfactory opacification of the pulmonary arteries to the segmental level. Examination is generally limited by extensive breath motion artifact throughout. There is extensive bilateral pulmonary embolus in the distal left pulmonary artery as well as all lobar and many more distal segmental to subsegmental branches. Cardiomegaly with enlargement of the RV LV ratio, approximately 1.3. No pericardial effusion. Mediastinum/Nodes: No enlarged mediastinal, hilar, or axillary lymph nodes. Thyroid gland, trachea, and esophagus demonstrate no significant findings. Lungs/Pleura: Small bilateral pleural effusions associated atelectasis or consolidation. Foci of peripheral subpleural heterogeneous airspace opacity in the bilateral lower lobes, the largest foci of which demonstrate some evidence of central clearing, most notably in the anterior segment right lower lobe (series 6, image 87). Upper Abdomen: No acute abnormality. Musculoskeletal: No chest wall abnormality. No acute or significant osseous findings. Review of the MIP images confirms the above findings.  IMPRESSION: 1. There is a large burden of extensive bilateral pulmonary embolus in the distal left pulmonary artery as well as all lobar and many more distal segmental to subsegmental branches. 2. Cardiomegaly with enlargement of the RV LV ratio, approximately 1.3, concerning for right heart strain. 3. Multiple foci of peripheral subpleural heterogeneous airspace opacity in the bilateral lower lobes, the largest foci of which demonstrate some evidence of central clearing, most notably in the anterior segment right lower lobe. Findings are consistent with developing bilateral pulmonary infarctions distal to occlusive embolus. 4. Small bilateral pleural effusions with associated atelectasis or consolidation. These results were called by telephone at the time of interpretation on 04/19/2020 at 2:57 pm to provider PA Doug Sou , who verbally acknowledged these results. Electronically Signed   By: Lauralyn Primes M.D.   On: 04/19/2020 15:01  US Venous Img Lower Bilateral (DVT)  Result Date: 04/19/2020 CLINICAL DATA:  49 year old with bilateral pulmonary emboli. EXAM: BILATERAL LOWER EXTREMITY VENOUS DOPPLER ULTRASOUND TECHNIQUE: Gray-scale sonography with graded compression, as well as color Doppler and duplex ultrasound were performed to evaluate the lower extremity deep venous systems from the level of the common femoral vein and including the common femoral, femoral, profunda femoral, popliteal and calf veins including the posterior tibial, peroneal and gastrocnemius veins when visible. The superficial great saphenous vein was also interrogated. Spectral Doppler was utilized to evaluate flow at rest and with distal augmentation maneuvers in the common femoral, femoral and popliteal veins. COMPARISON:  CT a chest 04/19/2020 FINDINGS: RIGHT LOWER EXTREMITY Common Femoral Vein: No evidence of thrombus. Normal compressibility, color Doppler flow and phasicity. Saphenofemoral Junction: No evidence of thrombus. Normal  compressibility and flow on color Doppler imaging. Profunda Femoral Vein: No evidence of thrombus. Normal compressibility and flow on color Doppler imaging. Femoral Vein: Positive for thrombus. Right femoral vein is not compressible with echogenic thrombus. Evidence for occlusive thrombus in the right femoral vein. Popliteal Vein: Positive for thrombus. Popliteal vein is not compressible and contains echogenic thrombus. Occlusive thrombus in the right popliteal vein. Calf Veins: Positive for thrombus. Evidence for thrombus involving right posterior tibial and right peroneal veins. Probable thrombus in the right anterior tibial veins. Superficial Great Saphenous Vein: No evidence of thrombus. Normal compressibility. Other Findings:  None. LEFT LOWER EXTREMITY Common Femoral Vein: Positive for thrombus. Nonocclusive thrombus in left common femoral vein. Saphenofemoral Junction: Positive for thrombus. Evidence for thrombus at the left saphenofemoral junction. Profunda Femoral Vein: No evidence of thrombus. Normal color Doppler flow in the left profunda femoral vein. Femoral Vein: Positive for thrombus. Echogenic thrombus in left femoral vein. Occlusive thrombus in the mid and distal left femoral vein. Popliteal Vein: Positive for thrombus. Nonocclusive thrombus in left popliteal vein. Calf Veins: Positive for thrombus. Thrombus involving a left peroneal vein. Thrombus involving left anterior tibial veins. Superficial Great Saphenous Vein: No evidence of thrombus. Normal compressibility. Other Findings:  None. IMPRESSION: 1. Positive for deep venous thrombosis in the right lower extremity. Thrombus involving the right femoral vein, right popliteal vein and right deep calf veins. 2. Positive for deep venous thrombosis in the left lower extremity. Thrombus involving the left common femoral vein, left saphenofemoral junction, left femoral vein, left popliteal vein and left deep calf veins. Electronically Signed   By: Markus Daft M.D.   On: 04/19/2020 17:27   ECHOCARDIOGRAM COMPLETE  Result Date: 04/19/2020    ECHOCARDIOGRAM REPORT   Patient Name:   TERREN JANDREAU Date of Exam: 04/19/2020 Medical Rec #:  [id number]    Height:       65.0 in Accession #:    [id number]   Weight:       155.0 lb Date of Birth:  [birth year]   BSA:          1.775 m Patient Age:    71 years     BP:           150/112 mmHg Patient Gender: M            HR:           109 bpm. Exam Location:  [facility] Procedure: 2D Echo Indications:    Pulmonary Embolus 415.19 / I26.99  History:        Patient has no prior history of Echocardiogram examinations.  Risk Factors:Current Smoker. Acute Pulmonary Embolism, Spider                 Bite, no known past medical history.  Sonographer:    Jeryl Columbia RDCS (AE) Referring Phys: [id number] MICHAEL B WERT IMPRESSIONS  1. Left ventricular ejection fraction, by estimation, is 70 to 75%. The left ventricle has hyperdynamic function. The left ventricle has no regional wall motion abnormalities. Left ventricular diastolic parameters are indeterminate.  2. There is ventricular septal flattening consistent with RV pressure overload. . Right ventricular systolic function is normal. The right ventricular size is mildly enlarged.  3. The mitral valve is normal in structure. No evidence of mitral valve regurgitation. No evidence of mitral stenosis.  4. The aortic valve is tricuspid. Aortic valve regurgitation is not visualized. No aortic stenosis is present.  5. Moderate to severe pulmonary HTN, PASP is 62 mmHg.  6. The inferior vena cava is normal in size with greater than 50% respiratory variability, suggesting right atrial pressure of 3 mmHg. FINDINGS  Left Ventricle: Left ventricular ejection fraction, by estimation, is 70 to 75%. The left ventricle has hyperdynamic function. The left ventricle has no regional wall motion abnormalities. The left ventricular internal cavity size was normal in size. There is no left ventricular  hypertrophy. Left ventricular diastolic parameters are indeterminate. Right Ventricle: There is ventricular septal flattening consistent with RV pressure overload. The right ventricular size is mildly enlarged. Right vetricular wall thickness was not assessed. Right ventricular systolic function is normal. The tricuspid regurgitant velocity is 3.83 m/s, and with an assumed right atrial pressure of 10 mmHg, the estimated right ventricular systolic pressure is 68.7 mmHg. Left Atrium: Left atrial size was normal in size. Right Atrium: Right atrial size was normal in size. Pericardium: There is no evidence of pericardial effusion. Mitral Valve: The mitral valve is normal in structure. No evidence of mitral valve regurgitation. No evidence of mitral valve stenosis. Tricuspid Valve: The tricuspid valve is normal in structure. Tricuspid valve regurgitation is mild . No evidence of tricuspid stenosis. Aortic Valve: The aortic valve is tricuspid. Aortic valve regurgitation is not visualized. No aortic stenosis is present. Aortic valve mean gradient measures 3.2 mmHg. Aortic valve peak gradient measures 7.7 mmHg. Aortic valve area, by VTI measures 2.96 cm. Pulmonic Valve: The pulmonic valve was not well visualized. Pulmonic valve regurgitation is not visualized. No evidence of pulmonic stenosis. Aorta: The aortic root is normal in size and structure. Pulmonary Artery: Moderate to severe pulmonary HTN, PASP is 62 mmHg. Venous: The inferior vena cava is normal in size with greater than 50% respiratory variability, suggesting right atrial pressure of 3 mmHg. IAS/Shunts: No atrial level shunt detected by color flow Doppler.  LEFT VENTRICLE PLAX 2D LVIDd:         4.08 cm  Diastology LVIDs:         2.07 cm  LV e' lateral:   8.05 cm/s LV PW:         0.96 cm  LV E/e' lateral: 7.8 LV IVS:        1.00 cm  LV e' medial:    6.85 cm/s LVOT diam:     2.00 cm  LV E/e' medial:  9.1 LV SV:         53 LV SV Index:   30 LVOT Area:     3.14 cm   RIGHT VENTRICLE RV S prime:     16.00 cm/s TAPSE (M-mode): 2.3 cm LEFT ATRIUM  Index       RIGHT ATRIUM           Index LA diam:      2.70 cm 1.52 cm/m  RA Area:     10.70 cm LA Vol (A2C): 13.0 ml 7.32 ml/m  RA Volume:   23.30 ml  13.13 ml/m LA Vol (A4C): 23.6 ml 13.30 ml/m  AORTIC VALVE AV Area (Vmax):    2.13 cm AV Area (Vmean):   2.00 cm AV Area (VTI):     2.96 cm AV Vmax:           138.75 cm/s AV Vmean:          81.412 cm/s AV VTI:            0.178 m AV Peak Grad:      7.7 mmHg AV Mean Grad:      3.2 mmHg LVOT Vmax:         94.10 cm/s LVOT Vmean:        51.764 cm/s LVOT VTI:          0.168 m LVOT/AV VTI ratio: 0.94  AORTA Ao Root diam: 2.90 cm MITRAL VALVE               TRICUSPID VALVE MV Area (PHT): 3.85 cm    TR Peak grad:   58.7 mmHg MV Decel Time: 197 msec    TR Vmax:        383.00 cm/s MV E velocity: 62.60 cm/s MV A velocity: 79.50 cm/s  SHUNTS MV E/A ratio:  0.79        Systemic VTI:  0.17 m                            Systemic Diam: 2.00 cm Dina Rich MD Electronically signed by Dina Rich MD Signature Date/Time: 04/19/2020/4:35:07 PM    Final    CT Renal Stone Study  Result Date: 04/19/2020 CLINICAL DATA:  Left flank pain 1 day. EXAM: CT ABDOMEN AND PELVIS WITHOUT CONTRAST TECHNIQUE: Multidetector CT imaging of the abdomen and pelvis was performed following the standard protocol without IV contrast. COMPARISON:  None. FINDINGS: Lower chest: Small pleural effusions bilaterally. Mild bibasilar patchy airspace disease. Heart size normal. Hepatobiliary: No focal liver abnormality is seen. No gallstones, gallbladder wall thickening, or biliary dilatation. Pancreas: Negative Spleen: Negative Adrenals/Urinary Tract: Adrenal glands are unremarkable. Kidneys are normal, without renal calculi, focal lesion, or hydronephrosis. Mild bladder wall thickening without mass or stone Stomach/Bowel: Mildly dilated small bowel loops in the epigastric region with air-fluid levels. Distal small  bowel and colon decompressed. Normal appendix. No bowel wall edema. Vascular/Lymphatic: Mild atherosclerotic calcification. No aortic aneurysm. No enlarged lymph nodes. Reproductive: Moderate to severe prostate enlargement measuring 5.8 x 5.0 x 5.0 cm. Other: No free fluid in the abdomen.  Negative for hernia Musculoskeletal: Mild degenerative changes in the lumbar spine. No acute skeletal abnormality. IMPRESSION: 1. No urinary tract calculi or hydronephrosis. 2. Dilated small bowel loops epigastric region with air-fluid levels. Probable partial small bowel obstruction. 3. Patchy bibasilar airspace disease which could represent atelectasis or pneumonia. Small bilateral pleural effusions. 4. Mild bladder wall thickening 5. Moderate to severe prostate enlargement. 6.  Aortic Atherosclerosis (ICD10-I70.0). Electronically Signed   By: Marlan Palau M.D.   On: 04/19/2020 13:36        Scheduled Meds: . Chlorhexidine Gluconate Cloth  6 each Topical Daily  . feeding supplement (ENSURE ENLIVE)  237 mL Oral BID BM  . mouth rinse  15 mL Mouth Rinse BID  . nicotine  7 mg Transdermal Daily  . pantoprazole  40 mg Oral BID AC   Continuous Infusions: . heparin 1,400 Units/hr (04/20/20 0950)     LOS: 1 day    Time spent: 35 minutes    Adarryl Goldammer D Sherryll Burger, DO [facility]  If 7PM-7AM, please contact night-coverage [email or web address] 04/20/2020, 11:28 AM

## 2020-04-20 NOTE — Progress Notes (Addendum)
ANTICOAGULATION CONSULT NOTE -   Pharmacy Consult for heparin IV Indication: pulmonary embolus  Allergies  Allergen Reactions  . Bee Venom Anaphylaxis    Patient Measurements: Height: 5\' 5"  (165.1 cm) Weight: 70.7 kg (155 lb 13.8 oz) IBW/kg (Calculated) : 61.5 Heparin Dosing Weight: 70.3 kg  Vital Signs: Temp: 98.3 F (36.8 C) (06/03 0738) Temp Source: Oral (06/03 0738) BP: 112/83 (06/03 0800) Pulse Rate: 86 (06/03 0800)  Labs: Recent Labs    04/19/20 1050 04/19/20 1050 04/19/20 1543 04/20/20 0437 04/20/20 0810  HGB 15.5   < >  --  13.1 13.1  HCT 48.0  --   --  39.9 39.9  PLT 229  --   --  226 201  APTT 35  --   --   --   --   LABPROT 14.5  --   --   --   --   INR 1.2  --   --   --   --   HEPARINUNFRC  --   --   --   --  0.21*  CREATININE 0.97  --   --  0.88  --   TROPONINIHS  --   --  2  --   --    < > = values in this interval not displayed.    Estimated Creatinine Clearance: 87.4 mL/min (by C-G formula based on SCr of 0.88 mg/dL).   Medical History: History reviewed. No pertinent past medical history.  Medications:  No medications prior to admission.    Assessment: Pharmacy consulted to dose heparin in patient with bilateral pulmonary embolism.  Patient is not on anticoagulation prior to admission.  HL 0.21: slightly subtherapeutic  Goal of Therapy:  Heparin level 0.3-0.7 units/ml Monitor platelets by anticoagulation protocol: Yes   Plan:  Rebolus 1000 units x 1  Increase heparin infusion to 1400 units/hr Check anti-Xa level in 6 hours and daily while on heparin. Continue to monitor H&H   06/20/20, PharmD Clinical Pharmacist 04/20/2020 9:29 AM

## 2020-04-20 NOTE — Progress Notes (Signed)
Dr. Sherryll Burger paged to see if something else could be ordered for pain d/t p/t receiving Morphine q4h on the dot and still c/o pain at a 7. Waiting for call back/orders. Will continue to monitor pt

## 2020-04-20 NOTE — Progress Notes (Signed)
Pt coughed up a dime size blood clot and wanted to make MD aware. Dr. Sherryll Burger paged and made aware. Pt educated on affects of smoking, pt in denial and states he is only coughing up blood because he is in the hospital. Waiting for orders/call back. Will continue to monitor pt

## 2020-04-20 NOTE — Progress Notes (Signed)
Pt states he feels like he is getting a rash on his L arm. There are a few bumps but nothing significant, no redness, swelling, warmth noted. Pt denies itching or pain. Will continue to monitor pt

## 2020-04-20 NOTE — Progress Notes (Signed)
ANTICOAGULATION CONSULT NOTE -   Pharmacy Consult for heparin IV Indication: pulmonary embolus  Allergies  Allergen Reactions  . Bee Venom Anaphylaxis  . Banana   . 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    Patient Measurements: Height: 5\' 5"  (165.1 cm) Weight: 70.7 kg (155 lb 13.8 oz) IBW/kg (Calculated) : 61.5 Heparin Dosing Weight: 70.3 kg  Vital Signs: Temp: 98.2 F (36.8 C) (06/03 1700) Temp Source: Axillary (06/03 1700) BP: 122/75 (06/03 2100) Pulse Rate: 92 (06/03 2100)  Labs: Recent Labs    04/19/20 1050 04/19/20 1050 04/19/20 1543 04/20/20 0437 04/20/20 0810 04/20/20 1615 04/20/20 2157  HGB 15.5   < >  --  13.1 13.1  --   --   HCT 48.0  --   --  39.9 39.9  --   --   PLT 229  --   --  226 201  --   --   APTT 35  --   --   --   --   --   --   LABPROT 14.5  --   --   --   --   --   --   INR 1.2  --   --   --   --   --   --   HEPARINUNFRC  --   --   --   --  0.21* 0.35 0.66  CREATININE 0.97  --   --  0.88  --   --   --   TROPONINIHS  --   --  2  --   --   --   --    < > = values in this interval not displayed.    Estimated Creatinine Clearance: 87.4 mL/min (by C-G formula based on SCr of 0.88 mg/dL).   Medical History: History reviewed. No pertinent past medical history.  Medications:  No medications prior to admission.    Assessment: Pharmacy consulted to dose heparin in patient with bilateral pulmonary embolism.  Patient is not on anticoagulation prior to admission.  6/3 1645 update Heparin level: 0.35 IU/mL, in therapeutic range, but higher end of goal is desired d/t high clot burden CBC remains WNL RN reports no bleeding complications or infusion site issues.  6/3 PM update:  Heparin level now at high end of goal as desired  Goal of Therapy:  Heparin level 0.3-0.7 units/ml, aim for higher end of goal  Monitor platelets by anticoagulation protocol: Yes   Plan:  Cont heparin at 1600 units/hr Confirmatory heparin level with AM labs  8/3, PharmD, BCPS Clinical Pharmacist Phone: [phone]

## 2020-04-20 NOTE — Progress Notes (Addendum)
ANTICOAGULATION CONSULT NOTE -   Pharmacy Consult for heparin IV Indication: pulmonary embolus  Allergies  Allergen Reactions  . Bee Venom Anaphylaxis  . Banana   . 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    Patient Measurements: Height: 5\' 5"  (165.1 cm) Weight: 70.7 kg (155 lb 13.8 oz) IBW/kg (Calculated) : 61.5 Heparin Dosing Weight: 70.3 kg  Vital Signs: Temp: 97.4 F (36.3 C) (06/03 1200) Temp Source: Axillary (06/03 1200) BP: 118/82 (06/03 1600) Pulse Rate: 89 (06/03 1600)  Labs: Recent Labs    04/19/20 1050 04/19/20 1050 04/19/20 1543 04/20/20 0437 04/20/20 0810  HGB 15.5   < >  --  13.1 13.1  HCT 48.0  --   --  39.9 39.9  PLT 229  --   --  226 201  APTT 35  --   --   --   --   LABPROT 14.5  --   --   --   --   INR 1.2  --   --   --   --   HEPARINUNFRC  --   --   --   --  0.21*  CREATININE 0.97  --   --  0.88  --   TROPONINIHS  --   --  2  --   --    < > = values in this interval not displayed.    Estimated Creatinine Clearance: 87.4 mL/min (by C-G formula based on SCr of 0.88 mg/dL).   Medical History: History reviewed. No pertinent past medical history.  Medications:  No medications prior to admission.    Assessment: Pharmacy consulted to dose heparin in patient with bilateral pulmonary embolism.  Patient is not on anticoagulation prior to admission.  6/3 1645 update Heparin level: 0.35 IU/mL, in therapeutic range, but higher end of goal is desired d/t high clot burden CBC remains WNL RN reports no bleeding complications or infusion site issues.  Goal of Therapy:  Heparin level 0.3-0.7 units/ml Monitor platelets by anticoagulation protocol: Yes   Plan:  Give heparin 2000 unit bolus now to get patient into  higher end of goal range Increase heparin infusion to 1600 units/hr Check anti-Xa level in 6 hours and daily while on heparin. Daily CBC  8/3, Pharm. D. Clinical Pharmacist 04/20/2020 4:56 PM

## 2020-04-20 NOTE — Progress Notes (Addendum)
NAME:  Terrence Christian, MRN:  [id number], DOB:  06/30/1971, LOS: 1 ADMISSION DATE:  04/19/2020, CONSULTATION DATE:  6/2 REFERRING MD:  [facility]/ ER, CHIEF COMPLAINT:  Massive PE    Brief History   48 yobm active smoker bitten by spider LLE several weeks PTA admit [facility] with resolution of leg symptoms then 2 days PTA noted L pleuritic cp and worsening sob > to er pm 6/2 with multiple PE so PCCM asked to consult re ? TPA needed.   History of present illness   Pt somnolent at time of eval p sedation pain meds so no independent hx available. Per EDP:   49 y.o. male with no known past medical history presents to emergency department today with chief complaint of progressively worsening left-sided abdominal pain x2 days.  Patient states pain is sharp and severe.  He rates pain 10 out of 10 in severity.  Patient denies any radiation of pain.  He states his pain is constant.  He has not had any medications for symptoms prior to arrival.  He denies history of similar pain.  Denies any sick contacts or known Covid exposures.   Past Medical History     Significant Hospital Events     Consults:  PCCM  Procedures:    Significant Diagnostic Tests:  CT a 6/2  Pos multiple PE Echo 6/2  RV strain but no RV function/ PASP = 62 and nl RA pressure Venous dopplers 6/2 Pos bilateral DVT   Micro Data:  Covid PCR  6/2 >>>  Antimicrobials:     Interim history/subjective:  Much less sob / still L pleuritic cp   Objective   Blood pressure 122/69, pulse 90, temperature (!) 97.4 F (36.3 C), temperature source Axillary, resp. rate (!) 27, height 5\' 5"  (1.651 m), weight 70.7 kg, SpO2 98 %.    FiO2 (%):  [28 %] 28 %   Intake/Output Summary (Last 24 hours) at 04/20/2020 1601 Last data filed at 04/20/2020 1539 Gross per 24 hour  Intake 1822.44 ml  Output 1800 ml  Net 22.44 ml   Filed Weights   04/19/20 1040 04/19/20 2148  Weight: 70.3 kg 70.7 kg    Examination: Tmax  99.1  Pt alert, approp nad @  30 degrees with sats 98% on 2lpm  No jvd Oropharynx clear,  mucosa nl Neck supple Lungs with a few scattered exp > insp rhonchi bilaterally RRR no s3 or - Pos increase in P2  Pulse down to 90  Abd soft/ nl  excursion  Extr warm with no edema or clubbing noted Neuro  Sensorium intact,  no apparent motor deficits    Assessment & Plan:   Multiple PE without hypoxemia or significant hemodynamic compromise likely because he has nl baseline cardiopulmonary function - however, this is a life threatening PE and clot burden is quite large.  Rec: No role for TPA at this point  Full dose IV Heparin until we see improvement and he is on therapeutic levels - no early transition to [place] here/ discussed with pharmacy  Prophylactic PPI while on full dose hep  OK to feed but full bed rest until therapeutic on hep x 24 h to allow clots to stabilize and prevent additional break off clots from bilateral dvt  Discussed dosing of heparin with pharmacy and need to observe bed rest directly with pt        Labs   CBC: Recent Labs  Lab 04/19/20 1050 04/20/20 0437 04/20/20 0810  WBC 12.7*  11.8* 11.1*  NEUTROABS 9.2*  --   --   HGB 15.5 13.1 13.1  HCT 48.0 39.9 39.9  MCV 88.9 87.9 88.7  PLT 229 226 201    Basic Metabolic Panel: Recent Labs  Lab 04/19/20 1050 04/20/20 0437  NA 133* 133*  K 3.6 4.0  CL 98 102  CO2 27 25  GLUCOSE 109* 94  BUN 11 9  CREATININE 0.97 0.88  CALCIUM 9.2 8.6*   GFR: Estimated Creatinine Clearance: 87.4 mL/min (by C-G formula based on SCr of 0.88 mg/dL). Recent Labs  Lab 04/19/20 1050 04/19/20 1058 04/20/20 0437 04/20/20 0810  WBC 12.7*  --  11.8* 11.1*  LATICACIDVEN  --  1.1  --   --     Liver Function Tests: Recent Labs  Lab 04/19/20 1050  AST 16  ALT 12  ALKPHOS 53  BILITOT 0.6  PROT 8.5*  ALBUMIN 3.3*   No results for input(s): LIPASE, AMYLASE in the last 168 hours. No results for input(s): AMMONIA in the last 168 hours.  ABG No  results found for: PHART, PCO2ART, PO2ART, HCO3, TCO2, ACIDBASEDEF, O2SAT   Coagulation Profile: Recent Labs  Lab 04/19/20 1050  INR 1.2    Cardiac Enzymes: No results for input(s): CKTOTAL, CKMB, CKMBINDEX, TROPONINI in the last 168 hours.  HbA1C: No results found for: HGBA1C  CBG: No results for input(s): GLUCAP in the last 168 hours.     Home Medications  Prior to Admission medications   Medication Sig Start Date End Date Taking? Authorizing Provider  ATIVAN 0.5 MG tablet Take 1 tablet by mouth every 6 (six) hours as needed. 03/26/20   [provider]      Sandrea Hughs, MD Pulmonary and Critical Care Medicine [facility] Cell [phone] After 6:00 PM or weekends, use Beeper [phone]  After 7:00 pm call Elink  [phone]

## 2020-04-20 NOTE — Progress Notes (Signed)
Pt requesting to get OOB to restroom to have a BM. Educated patient that he is on strict bed rest d/t "dangerous" blood clot per Dr. Sherene Sires and until heparin gtt is stabilized he needs to remain in bed. RN offered bed rest. Pt refused. Will continue to monitor pt.

## 2020-04-20 NOTE — Progress Notes (Signed)
Initial Nutrition Assessment  DOCUMENTATION CODES:   Not applicable  INTERVENTION:  Increase Ensure Enlive po TID, each supplement provides 350 kcal and 20 grams of protein (likes chocolate and vanilla) Magic cup BID with lunch and dinner meals, each supplement provides 290 kcal and 9 grams of protein   NUTRITION DIAGNOSIS:   Inadequate oral intake related to acute illness(acute pulmonary embolism) as evidenced by meal completion < 50%.  GOAL:   Patient will meet greater than or equal to 90% of their needs  MONITOR:   PO intake, Supplement acceptance, Labs, Weight trends, I & O's  REASON FOR ASSESSMENT:   Malnutrition Screening Tool    ASSESSMENT:  49 year old male admitted for acute pulmonary embolism after presenting with complaints of left flank pain and mild difficulty breathing. Past medical history significant for tobacco abuse and asthma.   Patient bitten by spider on LLE several weeks pta, admitted to [facility] with resolution of leg symptoms s/p antibitocs and recent 10 hour drive to [facility] 2 weeks ago without stopping or resting. Left pleuritic cp and worsening sob 2 days pta. CT obtained in ED revealed bilateral pleural effusions with right heart strain, findings also suggestive of bilateral pulmonary infractions distal to occlusive embolus, PCCM consulted.   Patient awake, alert laying flat in bed this afternoon at RD visit. Patient complaining of ongoing left side pain, states that at times the pain takes his breath away. Per flowsheets, patient consumed 40% of breakfast, he recalls eating the sausage and some of the french toast this morning, reports that he is allergic to bananas, and tea. [facility] has been updated with patient allergies. Patient reports decreased po intake over the past few weeks due to recently being bit by a black widow and worsening left side pain over the past week. 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 Will increase Ensure to TID, and provide Magic Cup with lunch and dinner meals.  Patient recalls weighing 180-185 lb ~9 months ago, endorses weight loss secondary to no longer working as a Clinical research associate and not having time to go the gym. Weight on admission 155.54 lb, no prior wt history available for review. Per pt report of UBW, he has experienced ~25 lb (13.6%) wt loss over the past 9 months which is insignificant.   Per notes: -no role for TPA -full bed rest, on heparin x 24 hours to allow clots to stabilize -echo and venous dopplers ordered  Medications reviewed and include: Protonix IVF: NaCl IV Heparin 12.5 ml/hr Labs: Na 133 (L)  NUTRITION - FOCUSED PHYSICAL EXAM: Limited exam due to pt report of pain at time of visit. No noted fat or muscle depletions to orbital, temporal, buccal, upper arm, clavicle, or clavicle acromion regions.  Diet Order:   Diet Order            Diet regular Room service appropriate? Yes; Fluid consistency: Thin  Diet effective now              EDUCATION NEEDS:   No education needs have been identified at this time  Skin:  Skin Assessment: Reviewed RN Assessment  Last BM:  6/01  Height:   Ht Readings from Last 1 Encounters:  04/19/20 5\' 5"  (1.651 m)    Weight:   Wt Readings from Last 1 Encounters:  04/19/20 70.7 kg    BMI:  Body mass index is 25.94 kg/m.  Estimated Nutritional Needs:  Kcal:  2125-2335  Protein:  110-120  Fluid:  >/= 2.1 L/day   Lars Masson, RD, LDN Clinical Nutrition After Hours/Weekend Pager # in Amion

## 2020-04-21 DIAGNOSIS — I2699 Other pulmonary embolism without acute cor pulmonale: Secondary | ICD-10-CM

## 2020-04-21 LAB — BASIC METABOLIC PANEL
Anion gap: 7 (ref 5–15)
BUN: 9 mg/dL (ref 6–20)
CO2: 28 mmol/L (ref 22–32)
Calcium: 8.7 mg/dL — ABNORMAL LOW (ref 8.9–10.3)
Chloride: 99 mmol/L (ref 98–111)
Creatinine, Ser: 0.82 mg/dL (ref 0.61–1.24)
GFR calc Af Amer: >60 mL/min (ref >60)
GFR calc non Af Amer: >60 mL/min (ref >60)
Glucose, Bld: 98 mg/dL (ref 70–99)
Potassium: 3.5 mmol/L (ref 3.5–5.1)
Sodium: 134 mmol/L — ABNORMAL LOW (ref 135–145)

## 2020-04-21 LAB — CBC
HCT: 40.2 % (ref 39.0–52.0)
Hemoglobin: 12.9 g/dL — ABNORMAL LOW (ref 13.0–17.0)
MCH: 28.5 pg (ref 26.0–34.0)
MCHC: 32.1 g/dL (ref 30.0–36.0)
MCV: 88.7 fL (ref 80.0–100.0)
Platelets: 238 10*3/uL (ref 150–400)
RBC: 4.53 MIL/uL (ref 4.22–5.81)
RDW: 15.1 % (ref 11.5–15.5)
WBC: 9.3 10*3/uL (ref 4.0–10.5)
nRBC: 0 % (ref 0.0–0.2)

## 2020-04-21 LAB — HEPARIN LEVEL (UNFRACTIONATED)
Heparin Unfractionated: 0.76 IU/mL — ABNORMAL HIGH (ref 0.30–0.70)
Heparin Unfractionated: 0.68 IU/mL (ref 0.30–0.70)

## 2020-04-21 LAB — MAGNESIUM: Magnesium: 1.8 mg/dL (ref 1.7–2.4)

## 2020-04-21 MED ORDER — CLONIDINE HCL 0.1 MG PO TABS
0.1000 mg | ORAL_TABLET | Freq: Two times a day (BID) | ORAL | Status: DC
  Administered 2020-04-21 – 2020-04-24 (×6): 0.1 mg via ORAL
  Filled 2020-04-21 (×6): qty 1

## 2020-04-21 MED ORDER — SENNOSIDES-DOCUSATE SODIUM 8.6-50 MG PO TABS
1.0000 | ORAL_TABLET | Freq: Two times a day (BID) | ORAL | Status: DC
  Administered 2020-04-21 – 2020-04-23 (×3): 1 via ORAL
  Filled 2020-04-21 (×4): qty 1

## 2020-04-21 MED ORDER — OXYCODONE HCL 5 MG PO TABS
10.0000 mg | ORAL_TABLET | ORAL | Status: DC | PRN
  Administered 2020-04-21 – 2020-04-24 (×23): 10 mg via ORAL
  Filled 2020-04-21 (×23): qty 2

## 2020-04-21 NOTE — TOC Initial Note (Signed)
Transition of Care [facility]) - Initial/Assessment Note   Patient Details  Name: Terrence Christian MRN: [id number] Date of Birth: 21-Feb-1971  [facility] CM/SW Contact:    Sherie Don, LCSW Phone Number: 04/21/2020, 12:36 PM  Clinical Narrative: Patient is a 49 year old male who was admitted for pulmonary embolism and infarction. TOC received consult for medication and insurance assistance. Patient is now on Eliquis. CSW met with patient to discuss [employer] and patient is agreeable to referral to determine Medicaid eligibility. CSW explained Eliquis card and MATCH voucher, which will be provided to patient at time of discharge. CSW referred patient to financial counselor, Tito Dine, to determine if patient is eligible for [employer].         Expected Discharge Plan: Home/Self Care Barriers to Discharge: Continued Medical Work up  Patient Goals and CMS Choice Patient states their goals for this hospitalization and ongoing recovery are:: Return home  Expected Discharge Plan and Services Expected Discharge Plan: Home/Self Care In-house Referral: Clinical Social Work, Conservation officer, nature Services: [place] [facility], Other - See comment(Eliquis card) Post Acute Care Choice: NA Living arrangements for the past 2 months: Single Family Home            DME Arranged: N/A DME Agency: NA HH Arranged: NA [place] Agency: NA  Prior Living Arrangements/Services Living arrangements for the past 2 months: Single Family Home Lives with:: Self Patient language and need for interpreter reviewed:: Yes Do you feel safe going back to the place where you live?: Yes      Need for Family Participation in Patient Care: No (Comment) Care giver support system in place?: No (comment) Criminal Activity/Legal Involvement Pertinent to Current Situation/Hospitalization: No - Comment as needed  Activities of Daily Living Home Assistive Devices/Equipment: None ADL Screening (condition at time of  admission) Patient's cognitive ability adequate to safely complete daily activities?: Yes Is the patient deaf or have difficulty hearing?: No Does the patient have difficulty seeing, even when wearing glasses/contacts?: No Does the patient have difficulty concentrating, remembering, or making decisions?: No Patient able to express need for assistance with ADLs?: Yes Does the patient have difficulty dressing or bathing?: No Independently performs ADLs?: Yes (appropriate for developmental age) Does the patient have difficulty walking or climbing stairs?: No Weakness of Legs: Both Weakness of Arms/Hands: Both  Permission Sought/Granted Permission sought to share information with : Other (comment)([employer]) Permission granted to share information with : Yes, Verbal Permission Granted Permission granted to share info w AGENCY: [employer] (Medicaid eligibility)  Emotional Assessment Appearance:: Appears stated age Attitude/Demeanor/Rapport: Engaged Affect (typically observed): Appropriate Orientation: : Oriented to Self, Oriented to Place, Oriented to  Time, Oriented to Situation Alcohol / Substance Use: Tobacco Use Psych Involvement: No (comment)  Admission diagnosis:  Pulmonary embolism ([place]) [I26.99] Pulmonary emboli ([place]) [I26.99] Pulmonary embolism and infarction [facility]) [I26.99] Patient Active Problem List   Diagnosis Date Noted  . Pulmonary embolism and infarction ([place]) 04/19/2020  . Asthma 04/19/2020   PCP:  Patient, [name] Pharmacy:   [facility] [address] [place] [address] Phone: [phone] Fax: [phone]  Readmission Risk Interventions No flowsheet data found.

## 2020-04-21 NOTE — Progress Notes (Signed)
NAME:  Terrence Christian, MRN:  [id number], DOB:  Jul 11, 1971, LOS: 2 ADMISSION DATE:  04/19/2020, CONSULTATION DATE:  6/2 REFERRING MD:  [facility]/ ER, CHIEF COMPLAINT:  Massive PE    Brief History   48 yobm active smoker bitten by spider LLE several weeks PTA admit [facility] with resolution of leg symptoms then 2 days PTA noted L pleuritic cp and worsening sob > to er pm 6/2 with multiple PE so PCCM asked to consult re ? TPA needed.   History of present illness   Pt somnolent at time of eval p sedation pain meds so no independent hx available. Per EDP:   49 y.o. male with no known past medical history presents to emergency department today with chief complaint of progressively worsening left-sided abdominal pain x2 days.  Patient states pain is sharp and severe.  He rates pain 10 out of 10 in severity.  Patient denies any radiation of pain.  He states his pain is constant.  He has not had any medications for symptoms prior to arrival.  He denies history of similar pain.  Denies any sick contacts or known Covid exposures.   Past Medical History     Significant Hospital Events     Consults:  PCCM  Procedures:    Significant Diagnostic Tests:  CT a 6/2  Pos multiple PE Echo 6/2  RV strain but nl RV function/ PASP = 62 and nl RA pressure Venous dopplers 6/2 Pos bilateral DVT   Micro Data:  Covid PCR  6/2 >>>  Antimicrobials:     Interim history/subjective:  No sob, still c/o pleuritic CP   Objective   Blood pressure (!) 144/99, pulse 84, temperature 98.9 F (37.2 C), temperature source Oral, resp. rate (!) 24, height 5\' 5"  (1.651 m), weight 70.1 kg, SpO2 99 %.        Intake/Output Summary (Last 24 hours) at 04/21/2020 1308 Last data filed at 04/21/2020 1240 Gross per 24 hour  Intake 1797.27 ml  Output 3000 ml  Net -1202.73 ml   Filed Weights   04/19/20 1040 04/19/20 2148 04/21/20 0500  Weight: 70.3 kg 70.7 kg 70.1 kg    Examination: Tmax   98.9  Pt alert, approp nad @ 30  degrees hob with sats 99% RA  No jvd Oropharynx clear,  mucosa nl Neck supple Lungs with distant bs  bilaterally RRR no s3 or or sign murmur - reduced P2  And pulse now in 80's suggesting improved stroke volume  Abd soft/ nl  excursion /good appetite Extr warm with no edema or clubbing noted Neuro  Sensorium intact,  no apparent motor deficits      Assessment & Plan:   Multiple PE without hypoxemia or significant hemodynamic compromise likely because he has nl baseline cardiopulmonary function - however, this is a life threatening PE and clot burden is quite large.  Rec: No role for TPA at this point  Full dose IV Heparin until we see improvement and he is on therapeutic levels - no early transition to [place] here/ discussed with pharmacy 6/2 and 6/3  Prophylactic PPI while on full dose hep    full bed rest until therapeutic on hep x 24 h to allow clots to stabilize and prevent additional break off clots from bilateral dvt   ok to start DOAC in 24 hours and d/c heparin if continue to do so well   2) L pleuritic cp typical of pulmonary infarction and unlikely to be assoc with large effusion  or need to address other than with analgesics  3) HBP / pain related > rec low dose clonidine which is also very cost effective in this setting         Labs   CBC: Recent Labs  Lab 04/19/20 1050 04/20/20 0437 04/20/20 0810 04/21/20 0423  WBC 12.7* 11.8* 11.1* 9.3  NEUTROABS 9.2*  --   --   --   HGB 15.5 13.1 13.1 12.9*  HCT 48.0 39.9 39.9 40.2  MCV 88.9 87.9 88.7 88.7  PLT 229 226 201 238    Basic Metabolic Panel: Recent Labs  Lab 04/19/20 1050 04/20/20 0437 04/21/20 0423  NA 133* 133* 134*  K 3.6 4.0 3.5  CL 98 102 99  CO2 27 25 28   GLUCOSE 109* 94 98  BUN 11 9 9   CREATININE 0.97 0.88 0.82  CALCIUM 9.2 8.6* 8.7*  MG  --   --  1.8   GFR: Estimated Creatinine Clearance: 93.8 mL/min (by C-G formula based on SCr of 0.82 mg/dL). Recent Labs  Lab 04/19/20 1050  04/19/20 1058 04/20/20 0437 04/20/20 0810 04/21/20 0423  WBC 12.7*  --  11.8* 11.1* 9.3  LATICACIDVEN  --  1.1  --   --   --     Liver Function Tests: Recent Labs  Lab 04/19/20 1050  AST 16  ALT 12  ALKPHOS 53  BILITOT 0.6  PROT 8.5*  ALBUMIN 3.3*   No results for input(s): LIPASE, AMYLASE in the last 168 hours. No results for input(s): AMMONIA in the last 168 hours.  ABG No results found for: PHART, PCO2ART, PO2ART, HCO3, TCO2, ACIDBASEDEF, O2SAT   Coagulation Profile: Recent Labs  Lab 04/19/20 1050  INR 1.2    Cardiac Enzymes: No results for input(s): CKTOTAL, CKMB, CKMBINDEX, TROPONINI in the last 168 hours.  HbA1C: No results found for: HGBA1C  CBG: No results for input(s): GLUCAP in the last 168 hours.     Home Medications  Prior to Admission medications   Medication Sig Start Date End Date Taking? Authorizing Provider  ATIVAN 0.5 MG tablet Take 1 tablet by mouth every 6 (six) hours as needed. 03/26/20   [provider]      06/19/20, MD Pulmonary and Critical Care Medicine [facility] Cell [phone] After 6:00 PM or weekends, use Beeper [phone]  After 7:00 pm call Elink  [phone]

## 2020-04-21 NOTE — Progress Notes (Signed)
ANTICOAGULATION CONSULT NOTE -   Pharmacy Consult for heparin IV Indication: pulmonary embolus  Allergies  Allergen Reactions  . Bee Venom Anaphylaxis  . Banana   . 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    Patient Measurements: Height: 5\' 5"  (165.1 cm) Weight: 70.1 kg (154 lb 8.7 oz) IBW/kg (Calculated) : 61.5 Heparin Dosing Weight: 70.3 kg  Vital Signs: Temp: 98.9 F (37.2 C) (06/04 1158) Temp Source: Oral (06/04 1158) BP: 129/78 (06/04 1300) Pulse Rate: 91 (06/04 1300)  Labs: Recent Labs    04/19/20 1050 04/19/20 1050 04/19/20 1543 04/20/20 0437 04/20/20 0437 04/20/20 0810 04/20/20 1615 04/20/20 2157 04/21/20 0423 04/21/20 1302  HGB 15.5   < >  --  13.1   < > 13.1  --   --  12.9*  --   HCT 48.0   < >  --  39.9  --  39.9  --   --  40.2  --   PLT 229   < >  --  226  --  201  --   --  238  --   APTT 35  --   --   --   --   --   --   --   --   --   LABPROT 14.5  --   --   --   --   --   --   --   --   --   INR 1.2  --   --   --   --   --   --   --   --   --   HEPARINUNFRC  --   --   --   --   --  0.21*   < > 0.66 0.76* 0.68  CREATININE 0.97  --   --  0.88  --   --   --   --  0.82  --   TROPONINIHS  --   --  2  --   --   --   --   --   --   --    < > = values in this interval not displayed.    Estimated Creatinine Clearance: 93.8 mL/min (by C-G formula based on SCr of 0.82 mg/dL).  Assessment: Pharmacy consulted to dose heparin in patient with bilateral pulmonary embolism.  Patient wasn't on anti-coagulation prior to admission.   6/4 1400 update: Heparin level: 0.68 IU/mL, now within higher end of goal range on heparint 1600 units/hr CBC remains WNL RN reports no bleeding complications or infusion site issues.   Goal of Therapy:  Heparin level 0.3-0.7 units/ml, aim for higher end of goal  Monitor platelets by anticoagulation protocol: Yes   Plan:  Continue  heparin infusion at 1550 units/hr to keep within high end of goal for 24 hours Daily heparin level and  CBC Monitor closely for s/s of bleeding F/U transition to DOAC upon patient improvement   8/4, Pharm. D. Clinical Pharmacist 04/21/2020 2:08 PM

## 2020-04-21 NOTE — Progress Notes (Signed)
ANTICOAGULATION CONSULT NOTE -   Pharmacy Consult for heparin IV Indication: pulmonary embolus  Allergies  Allergen Reactions  . Bee Venom Anaphylaxis  . Banana   . 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    Patient Measurements: Height: 5\' 5"  (165.1 cm) Weight: 70.1 kg (154 lb 8.7 oz) IBW/kg (Calculated) : 61.5 Heparin Dosing Weight: 70.3 kg  Vital Signs: Temp: 98.1 F (36.7 C) (06/04 0400) Temp Source: Oral (06/04 0400) BP: 113/78 (06/04 0700) Pulse Rate: 84 (06/04 0700)  Labs: Recent Labs    04/19/20 1050 04/19/20 1050 04/19/20 1543 04/20/20 0437 04/20/20 0437 04/20/20 0810 04/20/20 0810 04/20/20 1615 04/20/20 2157 04/21/20 0423  HGB 15.5   < >  --  13.1   < > 13.1  --   --   --  12.9*  HCT 48.0   < >  --  39.9  --  39.9  --   --   --  40.2  PLT 229   < >  --  226  --  201  --   --   --  238  APTT 35  --   --   --   --   --   --   --   --   --   LABPROT 14.5  --   --   --   --   --   --   --   --   --   INR 1.2  --   --   --   --   --   --   --   --   --   HEPARINUNFRC  --   --   --   --   --  0.21*   < > 0.35 0.66 0.76*  CREATININE 0.97  --   --  0.88  --   --   --   --   --  0.82  TROPONINIHS  --   --  2  --   --   --   --   --   --   --    < > = values in this interval not displayed.    Estimated Creatinine Clearance: 93.8 mL/min (by C-G formula based on SCr of 0.82 mg/dL).  Assessment: Pharmacy consulted to dose heparin in patient with bilateral pulmonary embolism.  Patient wasn't on anti-coagulation prior to admission.   6/4 0745 update: Heparin level: 0.76 IU/mL, now just a bit above higher end of goal on heparin at 1600 units/hr CBC remains WNL RN reports no bleeding complications or infusion site issues.   Goal of Therapy:  Heparin level 0.3-0.7 units/ml, aim for higher end of goal  Monitor platelets by anticoagulation protocol: Yes   Plan:  Decrease  heparin infusion rate to 1550 units/hr to keep within high end of goal  Confirmatory heparin level  with AM labs  8/4, Pharm. D. Clinical Pharmacist 04/21/2020 7:40 AM

## 2020-04-21 NOTE — Progress Notes (Signed)
PROGRESS NOTE    Terrence Christian  ZOX:[id number] DOB: 21-Nov-1970 DOA: 04/19/2020 PCP: Patient, [name]   Brief Narrative:  Per HPI: Terrence Brownis a 48 y.o.malewithmedical history significant for tobacco abuse, asthma. Patient presented to the ED with complaints of left flank pain that started yesterday. He initially denied difficulty breathing, but at the time of my evaluation he tells me his asthma is beginning to act up and he is having some mild difficulty breathing. He denies chest pain. No cough. 2 weeks ago, patient took a 10-hour drive to [place]. Without stopping or resting. He had no complaints and went about his normal activities until yesterday when the left flank pain started. No lower extremity redness swelling or pain. About 2 weeks ago he was also bitten by a spider and was placed on antibiotics for this. No family history of blood clots in lungs or legs.  -Patient noted to have extensive bilateral acute pulmonary embolism along with bilateral DVT noted.  2D echocardiogram with LVEF 70-75% and indeterminate LV diastolic parameters.  PASP 62.  He has been seen by pulmonology with recommendations to remain on IV heparin for now.  He remains on nasal cannula oxygen with stable vital signs currently noted.   Assessment & Plan:   Principal Problem:   Pulmonary embolism and infarction [facility](HCC) Active Problems:   Asthma   Acute bilateral PE along with bilateral DVT -Continue on IV heparin drip per pulmonology recommendations -Full bedrest for now until 6/5 -2D echocardiogram with results as noted below with LVEF 70-75% and PASP 62, stable vital signs noted -Continues to have flank pain for which I will prescribe pain medications as needed, likely related to pulmonary infarction with possible effusion.  May need repeat chest x-ray if this persists or worsens. -Continue supplemental oxygen -We will plan to arrange for DOAC on discharge  History of asthma -Currently  without any bronchospasms -Albuterol inhaler as needed  History of tobacco abuse -Counseled on cessation -Nicotine patch ordered  DVT prophylaxis: Heparin drip Code Status: Full code Family Communication: Discussed with brother on phone 6/3 Disposition Plan: Continue on heparin drip with pulmonology recommendations pending. Status is: Inpatient  Remains inpatient appropriate because:IV treatments appropriate due to intensity of illness or inability to take PO and Inpatient level of care appropriate due to severity of illness   Dispo: The patient is from: Home  Anticipated d/c is to: Home  Anticipated d/c date is: 3 days  Patient currently is not medically stable to d/c.  He continues to require IV heparin due to his clot burden with further recommendations from pulmonology still pending.  He is not stable for discharge until 5 full days of IV heparin.   Consultants:   PCCM  Procedures:   See below  Antimicrobials:  Anti-infectives (From admission, onward)   None       Subjective: Patient seen and evaluated today with no new acute complaints or concerns. No acute concerns or events noted overnight.  He continues to have some left-sided flank pain.  Objective: Vitals:   04/21/20 0800 04/21/20 0900 04/21/20 1000 04/21/20 1009  BP: (!) 136/112 132/89  126/88  Pulse: 84 82  90  Resp: (!) 23 (!) 24 20 (!) 25  Temp: 98 F (36.7 C)     TempSrc: Oral     SpO2: 96% 97%  94%  Weight:      Height:        Intake/Output Summary (Last 24 hours) at 04/21/2020 1153 Last data  filed at 04/21/2020 1057 Gross per 24 hour  Intake 1690.6 ml  Output 3000 ml  Net -1309.4 ml   Filed Weights   04/19/20 1040 04/19/20 2148 04/21/20 0500  Weight: 70.3 kg 70.7 kg 70.1 kg    Examination:  General exam: Appears calm and comfortable  Respiratory system: Clear to auscultation. Respiratory effort normal.  Currently on nasal cannula 2  L. Cardiovascular system: S1 & S2 heard, RRR. No JVD, murmurs, rubs, gallops or clicks. No pedal edema. Gastrointestinal system: Abdomen is nondistended, soft and nontender. No organomegaly or masses felt. Normal bowel sounds heard. Central nervous system: Alert and oriented. No focal neurological deficits. Extremities: Symmetric 5 x 5 power. Skin: No rashes, lesions or ulcers Psychiatry: Judgement and insight appear normal. Mood & affect appropriate.     Data Reviewed: I have personally reviewed following labs and imaging studies  CBC: Recent Labs  Lab 04/19/20 1050 04/20/20 0437 04/20/20 0810 04/21/20 0423  WBC 12.7* 11.8* 11.1* 9.3  NEUTROABS 9.2*  --   --   --   HGB 15.5 13.1 13.1 12.9*  HCT 48.0 39.9 39.9 40.2  MCV 88.9 87.9 88.7 88.7  PLT 229 226 201 238   Basic Metabolic Panel: Recent Labs  Lab 04/19/20 1050 04/20/20 0437 04/21/20 0423  NA 133* 133* 134*  K 3.6 4.0 3.5  CL 98 102 99  CO2 GLUCOSE 109* 94 98  BUN CREATININE 0.97 0.88 0.82  CALCIUM 9.2 8.6* 8.7*  MG  --   --  1.8   GFR: Estimated Creatinine Clearance: 93.8 mL/min (by C-G formula based on SCr of 0.82 mg/dL). Liver Function Tests: Recent Labs  Lab 04/19/20 1050  AST 16  ALT 12  ALKPHOS 53  BILITOT 0.6  PROT 8.5*  ALBUMIN 3.3*   No results for input(s): LIPASE, AMYLASE in the last 168 hours. No results for input(s): AMMONIA in the last 168 hours. Coagulation Profile: Recent Labs  Lab 04/19/20 1050  INR 1.2   Cardiac Enzymes: No results for input(s): CKTOTAL, CKMB, CKMBINDEX, TROPONINI in the last 168 hours. BNP (last 3 results) No results for input(s): PROBNP in the last 8760 hours. HbA1C: No results for input(s): HGBA1C in the last 72 hours. CBG: No results for input(s): GLUCAP in the last 168 hours. Lipid Profile: No results for input(s): CHOL, HDL, LDLCALC, TRIG, CHOLHDL, LDLDIRECT in the last 72 hours. Thyroid Function Tests: No results for input(s):  TSH, T4TOTAL, FREET4, T3FREE, THYROIDAB in the last 72 hours. Anemia Panel: No results for input(s): VITAMINB12, FOLATE, FERRITIN, TIBC, IRON, RETICCTPCT in the last 72 hours. Sepsis Labs: Recent Labs  Lab 04/19/20 1058  LATICACIDVEN 1.1    Recent Results (from the past 240 hour(s))  SARS Coronavirus 2 by RT PCR (hospital order, performed in [facility] hospital lab) Nasopharyngeal Nasopharyngeal Swab     Status: None   Collection Time: 04/19/20  3:15 PM   Specimen: Nasopharyngeal Swab  Result Value Ref Range Status   SARS Coronavirus 2 NEGATIVE NEGATIVE Final    Comment: (NOTE) SARS-CoV-2 target nucleic acids are NOT DETECTED. The SARS-CoV-2 RNA is generally detectable in upper and lower respiratory specimens during the acute phase of infection. The lowest concentration of SARS-CoV-2 viral copies this assay can detect is 250 copies / mL. A negative result does not preclude SARS-CoV-2 infection and should not be used as the sole basis for treatment or other patient management decisions.  A negative result may occur  with improper specimen collection / handling, submission of specimen other than nasopharyngeal swab, presence of viral mutation(s) within the areas targeted by this assay, and inadequate number of viral copies (<250 copies / mL). A negative result must be combined with clinical observations, patient history, and epidemiological information. Fact Sheet for Patients:   BoilerBrush.com.cy Fact Sheet for Healthcare Providers: https://pope.com/ This test is not yet approved or cleared  by the [place] FDA and has been authorized for detection and/or diagnosis of SARS-CoV-2 by FDA under an Emergency Use Authorization (EUA).  This EUA will remain in effect (meaning this test can be used) for the duration of the COVID-19 declaration under Section 564(b)(1) of the Act, 21 U.S.C. section 360bbb-3(b)(1), unless the  authorization is terminated or revoked sooner. Performed at [facility], [address], [place], [place] [address]   MRSA PCR Screening     Status: None   Collection Time: 04/19/20  9:36 PM   Specimen: Nasal Mucosa; Nasopharyngeal  Result Value Ref Range Status   MRSA by PCR NEGATIVE NEGATIVE Final    Comment:        The GeneXpert MRSA Assay (FDA approved for NASAL specimens only), is one component of a comprehensive MRSA colonization surveillance program. It is not intended to diagnose MRSA infection nor to guide or monitor treatment for MRSA infections. Performed at [facility], [address] [place], [place] [address]          Radiology Studies: CT Angio Chest PE W and/or Wo Contrast  Result Date: 04/19/2020 CLINICAL DATA:  Flank pain, airspace disease in lung bases on prior CT abdomen pelvis EXAM: CT ANGIOGRAPHY CHEST WITH CONTRAST TECHNIQUE: Multidetector CT imaging of the chest was performed using the standard protocol during bolus administration of intravenous contrast. Multiplanar CT image reconstructions and MIPs were obtained to evaluate the vascular anatomy. CONTRAST:  OMNIPAQUE IOHEXOL 350 MG/ML SOLN COMPARISON:  None. FINDINGS: Cardiovascular: Satisfactory opacification of the pulmonary arteries to the segmental level. Examination is generally limited by extensive breath motion artifact throughout. There is extensive bilateral pulmonary embolus in the distal left pulmonary artery as well as all lobar and many more distal segmental to subsegmental branches. Cardiomegaly with enlargement of the RV LV ratio, approximately 1.3. No pericardial effusion. Mediastinum/Nodes: No enlarged mediastinal, hilar, or axillary lymph nodes. Thyroid gland, trachea, and esophagus demonstrate no significant findings. Lungs/Pleura: Small bilateral pleural effusions associated atelectasis or consolidation. Foci of peripheral subpleural heterogeneous airspace opacity in the bilateral lower  lobes, the largest foci of which demonstrate some evidence of central clearing, most notably in the anterior segment right lower lobe (series 6, image 87). Upper Abdomen: No acute abnormality. Musculoskeletal: No chest wall abnormality. No acute or significant osseous findings. Review of the MIP images confirms the above findings. IMPRESSION: 1. There is a large burden of extensive bilateral pulmonary embolus in the distal left pulmonary artery as well as all lobar and many more distal segmental to subsegmental branches. 2. Cardiomegaly with enlargement of the RV LV ratio, approximately 1.3, concerning for right heart strain. 3. Multiple foci of peripheral subpleural heterogeneous airspace opacity in the bilateral lower lobes, the largest foci of which demonstrate some evidence of central clearing, most notably in the anterior segment right lower lobe. Findings are consistent with developing bilateral pulmonary infarctions distal to occlusive embolus. 4. Small bilateral pleural effusions with associated atelectasis or consolidation. These results were called by telephone at the time of interpretation on 04/19/2020 at 2:57 pm to provider PA [facility]  ALBRIZZE , who verbally acknowledged these results. Electronically Signed   By: Eddie Candle M.D.   On: 04/19/2020 15:01   US Venous Img Lower Bilateral (DVT)  Result Date: 04/19/2020 CLINICAL DATA:  49 year old with bilateral pulmonary emboli. EXAM: BILATERAL LOWER EXTREMITY VENOUS DOPPLER ULTRASOUND TECHNIQUE: Gray-scale sonography with graded compression, as well as color Doppler and duplex ultrasound were performed to evaluate the lower extremity deep venous systems from the level of the common femoral vein and including the common femoral, femoral, profunda femoral, popliteal and calf veins including the posterior tibial, peroneal and gastrocnemius veins when visible. The superficial great saphenous vein was also interrogated. Spectral Doppler was utilized to  evaluate flow at rest and with distal augmentation maneuvers in the common femoral, femoral and popliteal veins. COMPARISON:  CT a chest 04/19/2020 FINDINGS: RIGHT LOWER EXTREMITY Common Femoral Vein: No evidence of thrombus. Normal compressibility, color Doppler flow and phasicity. Saphenofemoral Junction: No evidence of thrombus. Normal compressibility and flow on color Doppler imaging. Profunda Femoral Vein: No evidence of thrombus. Normal compressibility and flow on color Doppler imaging. Femoral Vein: Positive for thrombus. Right femoral vein is not compressible with echogenic thrombus. Evidence for occlusive thrombus in the right femoral vein. Popliteal Vein: Positive for thrombus. Popliteal vein is not compressible and contains echogenic thrombus. Occlusive thrombus in the right popliteal vein. Calf Veins: Positive for thrombus. Evidence for thrombus involving right posterior tibial and right peroneal veins. Probable thrombus in the right anterior tibial veins. Superficial Great Saphenous Vein: No evidence of thrombus. Normal compressibility. Other Findings:  None. LEFT LOWER EXTREMITY Common Femoral Vein: Positive for thrombus. Nonocclusive thrombus in left common femoral vein. Saphenofemoral Junction: Positive for thrombus. Evidence for thrombus at the left saphenofemoral junction. Profunda Femoral Vein: No evidence of thrombus. Normal color Doppler flow in the left profunda femoral vein. Femoral Vein: Positive for thrombus. Echogenic thrombus in left femoral vein. Occlusive thrombus in the mid and distal left femoral vein. Popliteal Vein: Positive for thrombus. Nonocclusive thrombus in left popliteal vein. Calf Veins: Positive for thrombus. Thrombus involving a left peroneal vein. Thrombus involving left anterior tibial veins. Superficial Great Saphenous Vein: No evidence of thrombus. Normal compressibility. Other Findings:  None. IMPRESSION: 1. Positive for deep venous thrombosis in the right lower  extremity. Thrombus involving the right femoral vein, right popliteal vein and right deep calf veins. 2. Positive for deep venous thrombosis in the left lower extremity. Thrombus involving the left common femoral vein, left saphenofemoral junction, left femoral vein, left popliteal vein and left deep calf veins. Electronically Signed   By: Markus Daft M.D.   On: 04/19/2020 17:27   ECHOCARDIOGRAM COMPLETE  Result Date: 04/19/2020    ECHOCARDIOGRAM REPORT   Patient Name:   Terrence Christian Date of Exam: 04/19/2020 Medical Rec #:  [id number]    Height:       65.0 in Accession #:    [id number]   Weight:       155.0 lb Date of Birth:  04-Aug-1971   BSA:          1.775 m Patient Age:    66 years     BP:           150/112 mmHg Patient Gender: M            HR:           109 bpm. Exam Location:  [facility] Procedure: 2D Echo Indications:    Pulmonary Embolus 415.19 / I26.99  History:  Patient has no prior history of Echocardiogram examinations.                 Risk Factors:Current Smoker. Acute Pulmonary Embolism, Spider                 Bite, no known past medical history.  Sonographer:    Jeryl Columbia RDCS (AE) Referring Phys: [id number] MICHAEL B WERT IMPRESSIONS  1. Left ventricular ejection fraction, by estimation, is 70 to 75%. The left ventricle has hyperdynamic function. The left ventricle has no regional wall motion abnormalities. Left ventricular diastolic parameters are indeterminate.  2. There is ventricular septal flattening consistent with RV pressure overload. . Right ventricular systolic function is normal. The right ventricular size is mildly enlarged.  3. The mitral valve is normal in structure. No evidence of mitral valve regurgitation. No evidence of mitral stenosis.  4. The aortic valve is tricuspid. Aortic valve regurgitation is not visualized. No aortic stenosis is present.  5. Moderate to severe pulmonary HTN, PASP is 62 mmHg.  6. The inferior vena cava is normal in size with greater than 50% respiratory  variability, suggesting right atrial pressure of 3 mmHg. FINDINGS  Left Ventricle: Left ventricular ejection fraction, by estimation, is 70 to 75%. The left ventricle has hyperdynamic function. The left ventricle has no regional wall motion abnormalities. The left ventricular internal cavity size was normal in size. There is no left ventricular hypertrophy. Left ventricular diastolic parameters are indeterminate. Right Ventricle: There is ventricular septal flattening consistent with RV pressure overload. The right ventricular size is mildly enlarged. Right vetricular wall thickness was not assessed. Right ventricular systolic function is normal. The tricuspid regurgitant velocity is 3.83 m/s, and with an assumed right atrial pressure of 10 mmHg, the estimated right ventricular systolic pressure is 68.7 mmHg. Left Atrium: Left atrial size was normal in size. Right Atrium: Right atrial size was normal in size. Pericardium: There is no evidence of pericardial effusion. Mitral Valve: The mitral valve is normal in structure. No evidence of mitral valve regurgitation. No evidence of mitral valve stenosis. Tricuspid Valve: The tricuspid valve is normal in structure. Tricuspid valve regurgitation is mild . No evidence of tricuspid stenosis. Aortic Valve: The aortic valve is tricuspid. Aortic valve regurgitation is not visualized. No aortic stenosis is present. Aortic valve mean gradient measures 3.2 mmHg. Aortic valve peak gradient measures 7.7 mmHg. Aortic valve area, by VTI measures 2.96 cm. Pulmonic Valve: The pulmonic valve was not well visualized. Pulmonic valve regurgitation is not visualized. No evidence of pulmonic stenosis. Aorta: The aortic root is normal in size and structure. Pulmonary Artery: Moderate to severe pulmonary HTN, PASP is 62 mmHg. Venous: The inferior vena cava is normal in size with greater than 50% respiratory variability, suggesting right atrial pressure of 3 mmHg. IAS/Shunts: No atrial level  shunt detected by color flow Doppler.  LEFT VENTRICLE PLAX 2D LVIDd:         4.08 cm  Diastology LVIDs:         2.07 cm  LV e' lateral:   8.05 cm/s LV PW:         0.96 cm  LV E/e' lateral: 7.8 LV IVS:        1.00 cm  LV e' medial:    6.85 cm/s LVOT diam:     2.00 cm  LV E/e' medial:  9.1 LV SV:         53 LV SV Index:   30 LVOT Area:  3.14 cm  RIGHT VENTRICLE RV S prime:     16.00 cm/s TAPSE (M-mode): 2.3 cm LEFT ATRIUM           Index       RIGHT ATRIUM           Index LA diam:      2.70 cm 1.52 cm/m  RA Area:     10.70 cm LA Vol (A2C): 13.0 ml 7.32 ml/m  RA Volume:   23.30 ml  13.13 ml/m LA Vol (A4C): 23.6 ml 13.30 ml/m  AORTIC VALVE AV Area (Vmax):    2.13 cm AV Area (Vmean):   2.00 cm AV Area (VTI):     2.96 cm AV Vmax:           138.75 cm/s AV Vmean:          81.412 cm/s AV VTI:            0.178 m AV Peak Grad:      7.7 mmHg AV Mean Grad:      3.2 mmHg LVOT Vmax:         94.10 cm/s LVOT Vmean:        51.764 cm/s LVOT VTI:          0.168 m LVOT/AV VTI ratio: 0.94  AORTA Ao Root diam: 2.90 cm MITRAL VALVE               TRICUSPID VALVE MV Area (PHT): 3.85 cm    TR Peak grad:   58.7 mmHg MV Decel Time: 197 msec    TR Vmax:        383.00 cm/s MV E velocity: 62.60 cm/s MV A velocity: 79.50 cm/s  SHUNTS MV E/A ratio:  0.79        Systemic VTI:  0.17 m                            Systemic Diam: 2.00 cm Dina Rich MD Electronically signed by Dina Rich MD Signature Date/Time: 04/19/2020/4:35:07 PM    Final    CT Renal Stone Study  Result Date: 04/19/2020 CLINICAL DATA:  Left flank pain 1 day. EXAM: CT ABDOMEN AND PELVIS WITHOUT CONTRAST TECHNIQUE: Multidetector CT imaging of the abdomen and pelvis was performed following the standard protocol without IV contrast. COMPARISON:  None. FINDINGS: Lower chest: Small pleural effusions bilaterally. Mild bibasilar patchy airspace disease. Heart size normal. Hepatobiliary: No focal liver abnormality is seen. No gallstones, gallbladder wall thickening, or  biliary dilatation. Pancreas: Negative Spleen: Negative Adrenals/Urinary Tract: Adrenal glands are unremarkable. Kidneys are normal, without renal calculi, focal lesion, or hydronephrosis. Mild bladder wall thickening without mass or stone Stomach/Bowel: Mildly dilated small bowel loops in the epigastric region with air-fluid levels. Distal small bowel and colon decompressed. Normal appendix. No bowel wall edema. Vascular/Lymphatic: Mild atherosclerotic calcification. No aortic aneurysm. No enlarged lymph nodes. Reproductive: Moderate to severe prostate enlargement measuring 5.8 x 5.0 x 5.0 cm. Other: No free fluid in the abdomen.  Negative for hernia Musculoskeletal: Mild degenerative changes in the lumbar spine. No acute skeletal abnormality. IMPRESSION: 1. No urinary tract calculi or hydronephrosis. 2. Dilated small bowel loops epigastric region with air-fluid levels. Probable partial small bowel obstruction. 3. Patchy bibasilar airspace disease which could represent atelectasis or pneumonia. Small bilateral pleural effusions. 4. Mild bladder wall thickening 5. Moderate to severe prostate enlargement. 6.  Aortic Atherosclerosis (ICD10-I70.0). Electronically Signed   By: Marlan Palau M.D.  On: 04/19/2020 13:36        Scheduled Meds: . Chlorhexidine Gluconate Cloth  6 each Topical Daily  . feeding supplement (ENSURE ENLIVE)  237 mL Oral TID BM  . mouth rinse  15 mL Mouth Rinse BID  . nicotine  7 mg Transdermal Daily  . pantoprazole  40 mg Oral BID AC  . senna-docusate  1 tablet Oral BID   Continuous Infusions: . heparin 1,550 Units/hr (04/21/20 1057)     LOS: 2 days    Time spent: 35 minutes    Adelis Docter Hoover Brunette, DO [facility]  If 7PM-7AM, please contact night-coverage [email or web address] 04/21/2020, 11:53 AM

## 2020-04-22 LAB — CBC
HCT: 41.1 % (ref 39.0–52.0)
Hemoglobin: 13.2 g/dL (ref 13.0–17.0)
MCH: 28.2 pg (ref 26.0–34.0)
MCHC: 32.1 g/dL (ref 30.0–36.0)
MCV: 87.8 fL (ref 80.0–100.0)
Platelets: 282 10*3/uL (ref 150–400)
RBC: 4.68 MIL/uL (ref 4.22–5.81)
RDW: 14.7 % (ref 11.5–15.5)
WBC: 8.1 10*3/uL (ref 4.0–10.5)
nRBC: 0 % (ref 0.0–0.2)

## 2020-04-22 LAB — BASIC METABOLIC PANEL
Anion gap: 8 (ref 5–15)
BUN: 9 mg/dL (ref 6–20)
CO2: 27 mmol/L (ref 22–32)
Calcium: 9.1 mg/dL (ref 8.9–10.3)
Chloride: 99 mmol/L (ref 98–111)
Creatinine, Ser: 0.92 mg/dL (ref 0.61–1.24)
GFR calc Af Amer: >60 mL/min (ref >60)
GFR calc non Af Amer: >60 mL/min (ref >60)
Glucose, Bld: 104 mg/dL — ABNORMAL HIGH (ref 70–99)
Potassium: 3.8 mmol/L (ref 3.5–5.1)
Sodium: 134 mmol/L — ABNORMAL LOW (ref 135–145)

## 2020-04-22 LAB — HEPARIN LEVEL (UNFRACTIONATED): Heparin Unfractionated: 0.46 IU/mL (ref 0.30–0.70)

## 2020-04-22 MED ORDER — LIDOCAINE 5 % EX PTCH
1.0000 | MEDICATED_PATCH | CUTANEOUS | Status: DC
  Administered 2020-04-22 – 2020-04-23 (×2): 1 via TRANSDERMAL
  Filled 2020-04-22 (×6): qty 1

## 2020-04-22 NOTE — Progress Notes (Signed)
ANTICOAGULATION CONSULT NOTE -   Pharmacy Consult for heparin IV Indication: pulmonary embolus  Allergies  Allergen Reactions  . Bee Venom Anaphylaxis  . Banana   . 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    Patient Measurements: Height: 5\' 5"  (165.1 cm) Weight: 70.1 kg (154 lb 8.7 oz) IBW/kg (Calculated) : 61.5 Heparin Dosing Weight: 70.3 kg  Vital Signs: Temp: 98.1 F (36.7 C) (06/05 0729) Temp Source: Oral (06/05 0729) BP: 117/75 (06/05 0800) Pulse Rate: 83 (06/05 0800)  Labs: Recent Labs    04/19/20 1050 04/19/20 1050 04/19/20 1543 04/20/20 0437 04/20/20 0437 04/20/20 0810 04/20/20 1615 04/21/20 0423 04/21/20 1302 04/22/20 0415  HGB 15.5   < >  --  13.1   < > 13.1  --  12.9*  --  13.2  HCT 48.0   < >  --  39.9   < > 39.9  --  40.2  --  41.1  PLT 229   < >  --  226   < > 201  --  238  --  282  APTT 35  --   --   --   --   --   --   --   --   --   LABPROT 14.5  --   --   --   --   --   --   --   --   --   INR 1.2  --   --   --   --   --   --   --   --   --   HEPARINUNFRC  --   --   --   --   --  0.21*   < > 0.76* 0.68 0.46  CREATININE 0.97   < >  --  0.88  --   --   --  0.82  --  0.92  TROPONINIHS  --   --  2  --   --   --   --   --   --   --    < > = values in this interval not displayed.    Estimated Creatinine Clearance: 83.6 mL/min (by C-G formula based on SCr of 0.92 mg/dL).  Assessment: Pharmacy consulted to dose heparin in patient with bilateral pulmonary embolism.  Patient wasn't on anti-coagulation prior to admission.   6/4 1400 update: Heparin level: 0.46 IU/mL, now within higher end of goal range on heparint 1600 units/hr CBC remains WNL RN reports no bleeding complications or infusion site issues.   Goal of Therapy:  Heparin level 0.3-0.7 units/ml, aim for higher end of goal  Monitor platelets by anticoagulation protocol: Yes   Plan:  Continue  heparin infusion at 1550 units/hr to keep within high end of goal for 24 hours Daily heparin level and  CBC Monitor closely for s/s of bleeding F/U transition to DOAC upon patient improvement  8/4, PharmD, MBA, BCGP Clinical Pharmacist  04/22/2020 8:58 AM

## 2020-04-22 NOTE — Progress Notes (Signed)
PROGRESS NOTE    Terrence Christian  BPZ:[id number] DOB: May 06, 1971 DOA: 04/19/2020 PCP: Patient, [name]   Brief Narrative:  Per HPI: Terrence Brownis a 48 y.o.malewithmedical history significant for tobacco abuse, asthma. Patient presented to the ED with complaints of left flank pain that started yesterday. He initially denied difficulty breathing, but at the time of my evaluation he tells me his asthma is beginning to act up and he is having some mild difficulty breathing. He denies chest pain. No cough. 2 weeks ago, patient took a 10-hour drive to [place]. Without stopping or resting. He had no complaints and went about his normal activities until yesterday when the left flank pain started. No lower extremity redness swelling or pain. About 2 weeks ago he was also bitten by a spider and was placed on antibiotics for this. No family history of blood clots in lungs or legs.  -Patient noted to have extensive bilateral acute pulmonary embolism along with bilateral DVT noted. 2D echocardiogram with LVEF 70-75% and indeterminate LV diastolic parameters. PASP 62. He has been seen by pulmonology with recommendations to remain on IV heparin for now. He remains on nasal cannula oxygen with stable vital signs currently noted.  Assessment & Plan:   Principal Problem:   Pulmonary embolism and infarction [facility]) Active Problems:   Asthma   Acute bilateral PE along with bilateral DVT -Continue on IV heparin drip per pulmonology recommendations day 3/5. -Full bedrest canceled and patient may move about. -2D echocardiogram with results as noted below with LVEF 70-75% and PASP 62, stable vital signs noted -Continues to have flank pain for which I will prescribe pain medications as needed, likely related to pulmonary infarction with possible effusion.  May need repeat chest x-ray if this persists or worsens. -Lidocaine patch added for pain management to left chest wall -Continue supplemental  oxygen -We will plan to arrange for DOAC on discharge with Eliquis as arranged by TOC.  History of asthma -Currently without any bronchospasms -Albuterol inhaler as needed  History of tobacco abuse -Counseled on cessation -Nicotine patch ordered  DVT prophylaxis:Heparin drip Code Status:Full code Family Communication:Discussed with brother on phone6/3 Disposition Plan:Continue on heparin drip with pulmonology recommendations pending. Status is: Inpatient  Remains inpatient appropriate because:IV treatments appropriate due to intensity of illness or inability to take PO and Inpatient level of care appropriate due to severity of illness   Dispo: The patient is from:Home Anticipated d/c is EU:MPNT Anticipated d/c date is: 2 days Patient currentlyis not medically stable to d/c.He continues to require IV heparin due to his clot burden with further recommendations from pulmonology still pending. He is not stable for discharge until 5 full days of IV heparin. Currently day 3/5.   Consultants:  PCCM  Procedures:  See below  Antimicrobials:   None   Subjective: Patient seen and evaluated today with no new acute complaints or concerns. No acute concerns or events noted overnight.  He continues to have left-sided chest pain that appears to be stable.  He currently has adequate saturations on room air oxygen.  No further cough or hemoptysis noted.  Objective: Vitals:   04/22/20 1003 04/22/20 1100 04/22/20 1117 04/22/20 1200  BP: 104/71 98/65  118/83  Pulse: 89 80 80 81  Resp: 17 16 20  (!) 23  Temp:   (!) 97.5 F (36.4 C)   TempSrc:   Oral   SpO2: 97% 95% 97% 95%  Weight:      Height:  Intake/Output Summary (Last 24 hours) at 04/22/2020 1303 Last data filed at 04/22/2020 1220 Gross per 24 hour  Intake 1323.1 ml  Output 1000 ml  Net 323.1 ml   Filed Weights   04/19/20 1040 04/19/20 2148 04/21/20 0500    Weight: 70.3 kg 70.7 kg 70.1 kg    Examination:  General exam: Appears calm and comfortable  Respiratory system: Clear to auscultation. Respiratory effort normal. Cardiovascular system: S1 & S2 heard, RRR. No JVD, murmurs, rubs, gallops or clicks. No pedal edema. Gastrointestinal system: Abdomen is nondistended, soft and nontender. No organomegaly or masses felt. Normal bowel sounds heard. Central nervous system: Alert and oriented. No focal neurological deficits. Extremities: Symmetric 5 x 5 power. Skin: No rashes, lesions or ulcers Psychiatry: Judgement and insight appear normal. Mood & affect appropriate.     Data Reviewed: I have personally reviewed following labs and imaging studies  CBC: Recent Labs  Lab 04/19/20 1050 04/20/20 0437 04/20/20 0810 04/21/20 0423 04/22/20 0415  WBC 12.7* 11.8* 11.1* 9.3 8.1  NEUTROABS 9.2*  --   --   --   --   HGB 15.5 13.1 13.1 12.9* 13.2  HCT 48.0 39.9 39.9 40.2 41.1  MCV 88.9 87.9 88.7 88.7 87.8  PLT 229 226 201 238 282   Basic Metabolic Panel: Recent Labs  Lab 04/19/20 1050 04/20/20 0437 04/21/20 0423 04/22/20 0415  NA 133* 133* 134* 134*  K 3.6 4.0 3.5 3.8  CL 98 102 99 99  CO2 27 25 28 27   GLUCOSE 109* 94 98 104*  BUN 11 9 9 9   CREATININE 0.97 0.88 0.82 0.92  CALCIUM 9.2 8.6* 8.7* 9.1  MG  --   --  1.8  --    GFR: Estimated Creatinine Clearance: 83.6 mL/min (by C-G formula based on SCr of 0.92 mg/dL). Liver Function Tests: Recent Labs  Lab 04/19/20 1050  AST 16  ALT 12  ALKPHOS 53  BILITOT 0.6  PROT 8.5*  ALBUMIN 3.3*   No results for input(s): LIPASE, AMYLASE in the last 168 hours. No results for input(s): AMMONIA in the last 168 hours. Coagulation Profile: Recent Labs  Lab 04/19/20 1050  INR 1.2   Cardiac Enzymes: No results for input(s): CKTOTAL, CKMB, CKMBINDEX, TROPONINI in the last 168 hours. BNP (last 3 results) No results for input(s): PROBNP in the last 8760 hours. HbA1C: No results for  input(s): HGBA1C in the last 72 hours. CBG: No results for input(s): GLUCAP in the last 168 hours. Lipid Profile: No results for input(s): CHOL, HDL, LDLCALC, TRIG, CHOLHDL, LDLDIRECT in the last 72 hours. Thyroid Function Tests: No results for input(s): TSH, T4TOTAL, FREET4, T3FREE, THYROIDAB in the last 72 hours. Anemia Panel: No results for input(s): VITAMINB12, FOLATE, FERRITIN, TIBC, IRON, RETICCTPCT in the last 72 hours. Sepsis Labs: Recent Labs  Lab 04/19/20 1058  LATICACIDVEN 1.1    Recent Results (from the past 240 hour(s))  SARS Coronavirus 2 by RT PCR (hospital order, performed in [facility] hospital lab) Nasopharyngeal Nasopharyngeal Swab     Status: None   Collection Time: 04/19/20  3:15 PM   Specimen: Nasopharyngeal Swab  Result Value Ref Range Status   SARS Coronavirus 2 NEGATIVE NEGATIVE Final    Comment: (NOTE) SARS-CoV-2 target nucleic acids are NOT DETECTED. The SARS-CoV-2 RNA is generally detectable in upper and lower respiratory specimens during the acute phase of infection. The lowest concentration of SARS-CoV-2 viral copies this assay can detect is 250 copies / mL. A negative result  does not preclude SARS-CoV-2 infection and should not be used as the sole basis for treatment or other patient management decisions.  A negative result may occur with improper specimen collection / handling, submission of specimen other than nasopharyngeal swab, presence of viral mutation(s) within the areas targeted by this assay, and inadequate number of viral copies (<250 copies / mL). A negative result must be combined with clinical observations, patient history, and epidemiological information. Fact Sheet for Patients:   StrictlyIdeas.no Fact Sheet for Healthcare Providers: BankingDealers.co.za This test is not yet approved or cleared  by the [place] FDA and has been authorized for detection and/or diagnosis of  SARS-CoV-2 by FDA under an Emergency Use Authorization (EUA).  This EUA will remain in effect (meaning this test can be used) for the duration of the COVID-19 declaration under Section 564(b)(1) of the Act, 21 U.S.C. section 360bbb-3(b)(1), unless the authorization is terminated or revoked sooner. Performed at [facility], [address], [place], [place] [address]   MRSA PCR Screening     Status: None   Collection Time: 04/19/20  9:36 PM   Specimen: Nasal Mucosa; Nasopharyngeal  Result Value Ref Range Status   MRSA by PCR NEGATIVE NEGATIVE Final    Comment:        The GeneXpert MRSA Assay (FDA approved for NASAL specimens only), is one component of a comprehensive MRSA colonization surveillance program. It is not intended to diagnose MRSA infection nor to guide or monitor treatment for MRSA infections. Performed at [facility], [address], [place], [place] [address]          Radiology Studies: No results found.      Scheduled Meds: . Chlorhexidine Gluconate Cloth  6 each Topical Daily  . cloNIDine  0.1 mg Oral BID  . feeding supplement (ENSURE ENLIVE)  237 mL Oral TID BM  . lidocaine  1 patch Transdermal Q24H  . mouth rinse  15 mL Mouth Rinse BID  . nicotine  7 mg Transdermal Daily  . pantoprazole  40 mg Oral BID AC  . senna-docusate  1 tablet Oral BID   Continuous Infusions: . heparin 1,550 Units/hr (04/22/20 1220)     LOS: 3 days    Time spent: 30 minutes    Lilymarie Scroggins Darleen Crocker, DO [facility]  If 7PM-7AM, please contact night-coverage [email or web address] 04/22/2020, 1:03 PM

## 2020-04-23 LAB — BASIC METABOLIC PANEL
Anion gap: 7 (ref 5–15)
BUN: 10 mg/dL (ref 6–20)
CO2: 29 mmol/L (ref 22–32)
Calcium: 9.3 mg/dL (ref 8.9–10.3)
Chloride: 100 mmol/L (ref 98–111)
Creatinine, Ser: 0.98 mg/dL (ref 0.61–1.24)
GFR calc Af Amer: >60 mL/min (ref >60)
GFR calc non Af Amer: >60 mL/min (ref >60)
Glucose, Bld: 105 mg/dL — ABNORMAL HIGH (ref 70–99)
Potassium: 4.1 mmol/L (ref 3.5–5.1)
Sodium: 136 mmol/L (ref 135–145)

## 2020-04-23 LAB — CBC
HCT: 42.2 % (ref 39.0–52.0)
Hemoglobin: 13.5 g/dL (ref 13.0–17.0)
MCH: 28.2 pg (ref 26.0–34.0)
MCHC: 32 g/dL (ref 30.0–36.0)
MCV: 88.1 fL (ref 80.0–100.0)
Platelets: 295 10*3/uL (ref 150–400)
RBC: 4.79 MIL/uL (ref 4.22–5.81)
RDW: 14.6 % (ref 11.5–15.5)
WBC: 7.1 10*3/uL (ref 4.0–10.5)
nRBC: 0 % (ref 0.0–0.2)

## 2020-04-23 LAB — HEPARIN LEVEL (UNFRACTIONATED)
Heparin Unfractionated: 0.17 IU/mL — ABNORMAL LOW (ref 0.30–0.70)
Heparin Unfractionated: 0.49 IU/mL (ref 0.30–0.70)

## 2020-04-23 NOTE — Progress Notes (Signed)
PROGRESS NOTE    Terrence Christian  UTM:[id number] DOB: [birth year] DOA: 04/19/2020 PCP: Patient, [name]   Brief Narrative:  Per HPI: Terrence Brownis a 48 y.o.malewithmedical history significant for tobacco abuse, asthma. Patient presented to the ED with complaints of left flank pain that started yesterday. He initially denied difficulty breathing, but at the time of my evaluation he tells me his asthma is beginning to act up and he is having some mild difficulty breathing. He denies chest pain. No cough. 2 weeks ago, patient took a 10-hour drive to [place]. Without stopping or resting. He had no complaints and went about his normal activities until yesterday when the left flank pain started. No lower extremity redness swelling or pain. About 2 weeks ago he was also bitten by a spider and was placed on antibiotics for this. No family history of blood clots in lungs or legs.  -Patient noted to have extensive bilateral acute pulmonary embolism along with bilateral DVT noted. 2D echocardiogram with LVEF 70-75% and indeterminate LV diastolic parameters. PASP 62. He has been seen by pulmonology with recommendations to remain on IV heparin for now. He remains on nasal cannula oxygen with stable vital signs currently noted.   Assessment & Plan:   Principal Problem:   Pulmonary embolism and infarction [facility]) Active Problems:   Asthma   Acute bilateral PE along with bilateral DVT -Continue on IV heparin drip per pulmonology recommendations day 3/5. -Full bedrest canceled and patient may move about. -2D echocardiogram with results as noted below with LVEF 70-75% and PASP 62, stable vital signs noted -Continues to have flank pain for which I will prescribe pain medications as needed, likely related to pulmonary infarction with possible effusion. May need repeat chest x-ray if this persists or worsens. -Lidocaine patch added for pain management to left chest wall -Continue supplemental  oxygen -We will plan to arrange for DOAC on discharge with Eliquis as arranged by TOC.  History of asthma -Currently without any bronchospasms -Albuterol inhaler as needed  History of tobacco abuse -Counseled on cessation -Nicotine patch ordered  DVT prophylaxis:Heparin drip Code Status:Full code Family Communication:Discussed with brother on phone6/3 Disposition Plan:Continue on heparin drip with pulmonology recommendations pending. Status is: Inpatient  Remains inpatient appropriate because:IV treatments appropriate due to intensity of illness or inability to take PO and Inpatient level of care appropriate due to severity of illness   Dispo: The patient is from:Home Anticipated d/c is FK:CLEX Anticipated d/c date is: 1 day Patient currentlyis not medically stable to d/c.He continues to require IV heparin due to his clot burden with further recommendations from pulmonology still pending. He is not stable for dischargeuntil 5 full days of IV heparin. Currently day 4/5.   Consultants:  PCCM  Procedures:  See below  Antimicrobials:   None  Subjective: Patient seen and evaluated today with no new acute complaints or concerns. No acute concerns or events noted overnight.  He still continues to have chest pain to his left side, but this is well controlled with pain medication.  Objective: Vitals:   04/23/20 0700 04/23/20 0800 04/23/20 0803 04/23/20 1134  BP: 103/80     Pulse: 80 81    Resp: 18 18    Temp:   97.6 F (36.4 C) 98.1 F (36.7 C)  TempSrc:   Oral Oral  SpO2: 96% 94%    Weight:      Height:        Intake/Output Summary (Last 24 hours) at 04/23/2020 1318 Last  data filed at 04/23/2020 0825 Gross per 24 hour  Intake 1525.95 ml  Output 1000 ml  Net 525.95 ml   Filed Weights   04/19/20 1040 04/19/20 2148 04/21/20 0500  Weight: 70.3 kg 70.7 kg 70.1 kg    Examination:  General exam: Appears  calm and comfortable  Respiratory system: Clear to auscultation. Respiratory effort normal.  Currently on room air. Cardiovascular system: S1 & S2 heard, RRR, tachycardic. No JVD, murmurs, rubs, gallops or clicks. No pedal edema. Gastrointestinal system: Abdomen is nondistended, soft and nontender. No organomegaly or masses felt. Normal bowel sounds heard. Central nervous system: Alert and oriented. No focal neurological deficits. Extremities: Symmetric 5 x 5 power. Skin: No rashes, lesions or ulcers Psychiatry: Judgement and insight appear normal. Mood & affect appropriate.     Data Reviewed: I have personally reviewed following labs and imaging studies  CBC: Recent Labs  Lab 04/19/20 1050 04/19/20 1050 04/20/20 0437 04/20/20 0810 04/21/20 0423 04/22/20 0415 04/23/20 0308  WBC 12.7*   < > 11.8* 11.1* 9.3 8.1 7.1  NEUTROABS 9.2*  --   --   --   --   --   --   HGB 15.5   < > 13.1 13.1 12.9* 13.2 13.5  HCT 48.0   < > 39.9 39.9 40.2 41.1 42.2  MCV 88.9   < > 87.9 88.7 88.7 87.8 88.1  PLT 229   < > 226 201 238 282 295   < > = values in this interval not displayed.   Basic Metabolic Panel: Recent Labs  Lab 04/19/20 1050 04/20/20 0437 04/21/20 0423 04/22/20 0415 04/23/20 0308  NA 133* 133* 134* 134* 136  K 3.6 4.0 3.5 3.8 4.1  CL 98 102 99 99 100  CO2 27 25 28 27 29   GLUCOSE 109* 94 98 104* 105*  BUN 11 9 9 9 10   CREATININE 0.97 0.88 0.82 0.92 0.98  CALCIUM 9.2 8.6* 8.7* 9.1 9.3  MG  --   --  1.8  --   --    GFR: Estimated Creatinine Clearance: 78.4 mL/min (by C-G formula based on SCr of 0.98 mg/dL). Liver Function Tests: Recent Labs  Lab 04/19/20 1050  AST 16  ALT 12  ALKPHOS 53  BILITOT 0.6  PROT 8.5*  ALBUMIN 3.3*   No results for input(s): LIPASE, AMYLASE in the last 168 hours. No results for input(s): AMMONIA in the last 168 hours. Coagulation Profile: Recent Labs  Lab 04/19/20 1050  INR 1.2   Cardiac Enzymes: No results for input(s): CKTOTAL,  CKMB, CKMBINDEX, TROPONINI in the last 168 hours. BNP (last 3 results) No results for input(s): PROBNP in the last 8760 hours. HbA1C: No results for input(s): HGBA1C in the last 72 hours. CBG: No results for input(s): GLUCAP in the last 168 hours. Lipid Profile: No results for input(s): CHOL, HDL, LDLCALC, TRIG, CHOLHDL, LDLDIRECT in the last 72 hours. Thyroid Function Tests: No results for input(s): TSH, T4TOTAL, FREET4, T3FREE, THYROIDAB in the last 72 hours. Anemia Panel: No results for input(s): VITAMINB12, FOLATE, FERRITIN, TIBC, IRON, RETICCTPCT in the last 72 hours. Sepsis Labs: Recent Labs  Lab 04/19/20 1058  LATICACIDVEN 1.1    Recent Results (from the past 240 hour(s))  SARS Coronavirus 2 by RT PCR (hospital order, performed in [facility] hospital lab) Nasopharyngeal Nasopharyngeal Swab     Status: None   Collection Time: 04/19/20  3:15 PM   Specimen: Nasopharyngeal Swab  Result Value Ref Range Status  SARS Coronavirus 2 NEGATIVE NEGATIVE Final    Comment: (NOTE) SARS-CoV-2 target nucleic acids are NOT DETECTED. The SARS-CoV-2 RNA is generally detectable in upper and lower respiratory specimens during the acute phase of infection. The lowest concentration of SARS-CoV-2 viral copies this assay can detect is 250 copies / mL. A negative result does not preclude SARS-CoV-2 infection and should not be used as the sole basis for treatment or other patient management decisions.  A negative result may occur with improper specimen collection / handling, submission of specimen other than nasopharyngeal swab, presence of viral mutation(s) within the areas targeted by this assay, and inadequate number of viral copies (<250 copies / mL). A negative result must be combined with clinical observations, patient history, and epidemiological information. Fact Sheet for Patients:   BoilerBrush.com.cy Fact Sheet for Healthcare  Providers: https://pope.com/ This test is not yet approved or cleared  by the [place] FDA and has been authorized for detection and/or diagnosis of SARS-CoV-2 by FDA under an Emergency Use Authorization (EUA).  This EUA will remain in effect (meaning this test can be used) for the duration of the COVID-19 declaration under Section 564(b)(1) of the Act, 21 U.S.C. section 360bbb-3(b)(1), unless the authorization is terminated or revoked sooner. Performed at [facility], [address], [place], [place] [address]   MRSA PCR Screening     Status: None   Collection Time: 04/19/20  9:36 PM   Specimen: Nasal Mucosa; Nasopharyngeal  Result Value Ref Range Status   MRSA by PCR NEGATIVE NEGATIVE Final    Comment:        The GeneXpert MRSA Assay (FDA approved for NASAL specimens only), is one component of a comprehensive MRSA colonization surveillance program. It is not intended to diagnose MRSA infection nor to guide or monitor treatment for MRSA infections. Performed at [facility], [address] [place], [place] [address]          Radiology Studies: No results found.      Scheduled Meds: . Chlorhexidine Gluconate Cloth  6 each Topical Daily  . cloNIDine  0.1 mg Oral BID  . feeding supplement (ENSURE ENLIVE)  237 mL Oral TID BM  . lidocaine  1 patch Transdermal Q24H  . mouth rinse  15 mL Mouth Rinse BID  . nicotine  7 mg Transdermal Daily  . pantoprazole  40 mg Oral BID AC  . senna-docusate  1 tablet Oral BID   Continuous Infusions: . heparin 1,550 Units/hr (04/23/20 0310)     LOS: 4 days    Time spent: 30 minutes    Kayanna Mckillop Hoover Brunette, DO [facility]  If 7PM-7AM, please contact night-coverage [email or web address] 04/23/2020, 1:18 PM

## 2020-04-23 NOTE — Progress Notes (Signed)
RN checked in on patient during 2 hour rounds- pt was resting quietly. 5 minutes later- monitor shows all leads off. RN went in patients room and patient was standing up at toilet voiding (when pt had been using urinal all night)- patient had ripped off of his leads, his pulse ox and pulled his IV out. When RN asked patient what happened, he stated "My IV came out." RN asked how his IV came out and why he didn't call (Pt A&Ox4)- pt stated, "I had a bad dream someone was holding me down." Pts eyes are blood shot red and he looks like he is concerned about something. RN asked if he thought the pain medication he is receiving caused this and the patient stated, "NO." Heparin gtt on hold while new IV has been attempted. All bed linens changed. VSS remain stable, will continue to monitor patient.

## 2020-04-23 NOTE — Progress Notes (Signed)
ANTICOAGULATION CONSULT NOTE -   Pharmacy Consult for heparin IV Indication: pulmonary embolus  Allergies  Allergen Reactions  . Bee Venom Anaphylaxis  . Banana   . 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    Patient Measurements: Height: 5\' 5"  (165.1 cm) Weight: 70.1 kg (154 lb 8.7 oz) IBW/kg (Calculated) : 61.5 Heparin Dosing Weight: 70.3 kg  Vital Signs: Temp: 97.6 F (36.4 C) (06/06 0803) Temp Source: Oral (06/06 0803) BP: 103/80 (06/06 0700) Pulse Rate: 81 (06/06 0800)  Labs: Recent Labs    04/21/20 0423 04/21/20 1302 04/22/20 0415 04/23/20 0308 04/23/20 0746  HGB 12.9*  --  13.2 13.5  --   HCT 40.2  --  41.1 42.2  --   PLT 238  --  282 295  --   HEPARINUNFRC 0.76*   < > 0.46 0.17* 0.49  CREATININE 0.82  --  0.92 0.98  --    < > = values in this interval not displayed.    Estimated Creatinine Clearance: 78.4 mL/min (by C-G formula based on SCr of 0.98 mg/dL).  Assessment: Pharmacy consulted to dose heparin in patient with bilateral pulmonary embolism.  Patient wasn't on anti-coagulation prior to admission.   6/6 0800 update: Heparin level: 0.49 IU/mL, now within higher end of goal range on heparin 1550 units/hr CBC remains WNL RN reports no bleeding complications or infusion site issues.   Goal of Therapy:  Heparin level 0.3-0.7 units/ml, aim for higher end of goal  Monitor platelets by anticoagulation protocol: Yes   Plan:  Continue  heparin infusion at 1550 units/hr Daily heparin level and CBC Monitor closely for s/s of bleeding F/U transition to DOAC upon patient improvement  06/23/20, PharmD, MBA, BCGP Clinical Pharmacist  04/23/2020 8:32 AM

## 2020-04-24 LAB — CBC
HCT: 43.2 % (ref 39.0–52.0)
Hemoglobin: 13.9 g/dL (ref 13.0–17.0)
MCH: 28.7 pg (ref 26.0–34.0)
MCHC: 32.2 g/dL (ref 30.0–36.0)
MCV: 89.3 fL (ref 80.0–100.0)
Platelets: 350 10*3/uL (ref 150–400)
RBC: 4.84 MIL/uL (ref 4.22–5.81)
RDW: 14.6 % (ref 11.5–15.5)
WBC: 7.4 10*3/uL (ref 4.0–10.5)
nRBC: 0 % (ref 0.0–0.2)

## 2020-04-24 LAB — HEPARIN LEVEL (UNFRACTIONATED): Heparin Unfractionated: 0.62 IU/mL (ref 0.30–0.70)

## 2020-04-24 MED ORDER — APIXABAN 5 MG PO TABS: 10.0000 mg | ORAL_TABLET | Freq: Two times a day (BID) | ORAL | 0 refills | Status: DC

## 2020-04-24 MED ORDER — APIXABAN 5 MG PO TABS: 5.0000 mg | ORAL_TABLET | Freq: Two times a day (BID) | ORAL | 3 refills | Status: DC

## 2020-04-24 MED ORDER — OXYCODONE HCL 10 MG PO TABS: 10.0000 mg | ORAL_TABLET | Freq: Four times a day (QID) | ORAL | 0 refills | Status: DC | PRN

## 2020-04-24 MED ORDER — APIXABAN 5 MG PO TABS
10.0000 mg | ORAL_TABLET | Freq: Two times a day (BID) | ORAL | Status: DC
  Administered 2020-04-24: 10 mg via ORAL
  Filled 2020-04-24: qty 2

## 2020-04-24 MED ORDER — SENNOSIDES-DOCUSATE SODIUM 8.6-50 MG PO TABS: 1.0000 | ORAL_TABLET | Freq: Two times a day (BID) | ORAL | 0 refills | Status: AC

## 2020-04-24 MED ORDER — APIXABAN 5 MG PO TABS: 5.0000 mg | ORAL_TABLET | Freq: Two times a day (BID) | ORAL | Status: DC

## 2020-04-24 NOTE — Progress Notes (Addendum)
ANTICOAGULATION CONSULT NOTE -   Pharmacy Consult for heparin IV-->transition to Eliquis Indication: pulmonary embolus  Allergies  Allergen Reactions  . Bee Venom Anaphylaxis  . Banana   . 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    Estimated Creatinine Clearance: 78.4 mL/min (by C-G formula based on SCr of 0.98 mg/dL).  Assessment: Pharmacy consulted to dose heparin in patient with bilateral pulmonary embolism.  Patient wasn't on anti-coagulation prior to admission.   6/7 0800 update: Heparin level: 0.62 IU/mL, now within higher end of goal range on heparin 1550 units/hr CBC remains WNL RN reports no bleeding complications or infusion site issues.   Goal of Therapy:  Heparin level 0.3-0.7 units/ml, aim for higher end of goal  Monitor platelets by anticoagulation protocol: Yes     Criteria for dosing reviewed: 1)  Age >/= 80 years 2)  Weight </= 60 kg 3)  Serum Creatinine >/= 1.5    Plan:  -Discontinue  heparin infusion    - Will begin apixaban 10mg  twice daily for 7 days, then apixaban 5mg  bid thereafter  - Monitor for s/s of bleeding   - Provide education prior to discharge for this new medication.     8/7, Pharm. D. Clinical Pharmacist 04/24/2020 9:08 AM

## 2020-04-24 NOTE — TOC Transition Note (Signed)
Transition of Care [facility]) - CM/SW Discharge Note   Patient Details  Name: Terrence Christian MRN: [id number] Date of Birth: 05/22/1971  [facility] CM/SW Contact:  Elliot Gault, LCSW Phone Number: 04/24/2020, 11:47 AM   Clinical Narrative:     Pt stable for dc per MD. Pharmacy provided pt with 30 day free coupon for Eliquis. Pt's other medications are narcotics and MATCH program will not cover the cost so no MATCH voucher given. Spoke with pt about follow up care. Referred pt to [facility] and [facility] at [facility] will call pt tomorrow on his brother's cell number to schedule in home appointment to assist pt with paperwork for enrollment in the program. They will then assist pt with follow up medical care and prescription assistance.  Pt has written and verbal information on above. Pt was also provided with a list of PCP's accepting new patients for future reference in the event pt eventually obtains insurance.  There are no other TOC needs for dc.  Final next level of care: Home/Self Care Barriers to Discharge: Barriers Resolved   Patient Goals and CMS Choice Patient states their goals for this hospitalization and ongoing recovery are:: Return home      Discharge Placement                       Discharge Plan and Services In-house Referral: Clinical Social Work, Conservator, museum/gallery Services: MATCH Program, Other - See comment(Eliquis card) Post Acute Care Choice: NA          DME Arranged: N/A DME Agency: NA       HH Arranged: NA HH Agency: NA        Social Determinants of Health (SDOH) Interventions     Readmission Risk Interventions No flowsheet data found.

## 2020-04-24 NOTE — Discharge Summary (Signed)
Physician Discharge Summary  Terrence Christian VFI:[id number] DOB: Apr 06, 1971 DOA: 04/19/2020  PCP: Patient, [name]  Admit date: 04/19/2020  Discharge date: 04/24/2020  Admitted From:Home  Disposition:  Home  Recommendations for Outpatient Follow-up:  1. Follow up with new PCP in 1-2 weeks, will be scheduled with [facility] 2. Continue on Eliquis anticoagulation as prescribed and likely will need at least 6 months given clot burden.  Appears to have been provoked from long distance drive and smoking history. 3. Counseled on smoking cessation 4. Pain medication with oxycodone given number 15 tablets with 0 refills  Home Health: None  Equipment/Devices: None  Discharge Condition: Stable  CODE STATUS: Full  Diet recommendation: Heart Healthy  Brief/Interim Summary: Per HPI: Terrence Brownis a 49 y.o.malewithmedical history significant for tobacco abuse, asthma. Patient presented to the ED with complaints of left flank pain that started yesterday. He initially denied difficulty breathing, but at the time of my evaluation he tells me his asthma is beginning to act up and he is having some mild difficulty breathing. He denies chest pain. No cough. 2 weeks ago, patient took a 10-hour drive to [place]. Without stopping or resting. He had no complaints and went about his normal activities until yesterday when the left flank pain started. No lower extremity redness swelling or pain. About 2 weeks ago he was also bitten by a spider and was placed on antibiotics for this. No family history of blood clots in lungs or legs.  -Patient noted to have extensive bilateral acute pulmonary embolism along with bilateral DVT noted. 2D echocardiogram with LVEF 70-75% and indeterminate LV diastolic parameters. PASP 62. He has been seen by pulmonology with recommendations to remain on heparin drip for period of 5 days and this has been completed.  He is no longer tachycardic and has stable blood  pressure readings and has remained on room air.  He continues to have some left-sided chest pain likely secondary to pulmonary infarction and will be given pain medications for management.  He has been transitioned over to Lasix and will be given savings card on discharge and will have to follow-up closely with a new PCP through [facility].  This will be arranged by TOC.  He is overall stable for discharge today.  Discharge Diagnoses:  Principal Problem:   Pulmonary embolism and infarction [facility]) Active Problems:   Asthma  Principal discharge diagnosis: Acute bilateral PE and bilateral DVT-provoked in setting of long distance drive with associated pulmonary infarction.  Discharge Instructions  Discharge Instructions    Diet - low sodium heart healthy   Complete by: As directed    Increase activity slowly   Complete by: As directed      Allergies as of 04/24/2020      Reactions   Bee Venom Anaphylaxis   Banana    Other    Pt allergic to tea      Medication List    TAKE these medications   apixaban 5 MG Tabs tablet Commonly known as: ELIQUIS Take 2 tablets (10 mg total) by mouth 2 (two) times daily for 7 days.   apixaban 5 MG Tabs tablet Commonly known as: ELIQUIS Take 1 tablet (5 mg total) by mouth 2 (two) times daily. Start taking on: May 01, 2020   Oxycodone HCl 10 MG Tabs Take 1 tablet (10 mg total) by mouth every 6 (six) hours as needed for moderate pain, severe pain or breakthrough pain.   senna-docusate 8.6-50 MG tablet Commonly known as:  Senokot-S Take 1 tablet by mouth 2 (two) times daily.      Follow-up Information    pcp Follow up in 1 week(s).          Allergies  Allergen Reactions  . Bee Venom Anaphylaxis  . Banana   . 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    Consultations:  Pulmonology   Procedures/Studies: CT Angio Chest PE W and/or Wo Contrast  Result Date: 04/19/2020 CLINICAL DATA:  Flank pain, airspace disease in lung bases on prior CT  abdomen pelvis EXAM: CT ANGIOGRAPHY CHEST WITH CONTRAST TECHNIQUE: Multidetector CT imaging of the chest was performed using the standard protocol during bolus administration of intravenous contrast. Multiplanar CT image reconstructions and MIPs were obtained to evaluate the vascular anatomy. CONTRAST:  OMNIPAQUE IOHEXOL 350 MG/ML SOLN COMPARISON:  None. FINDINGS: Cardiovascular: Satisfactory opacification of the pulmonary arteries to the segmental level. Examination is generally limited by extensive breath motion artifact throughout. There is extensive bilateral pulmonary embolus in the distal left pulmonary artery as well as all lobar and many more distal segmental to subsegmental branches. Cardiomegaly with enlargement of the RV LV ratio, approximately 1.3. No pericardial effusion. Mediastinum/Nodes: No enlarged mediastinal, hilar, or axillary lymph nodes. Thyroid gland, trachea, and esophagus demonstrate no significant findings. Lungs/Pleura: Small bilateral pleural effusions associated atelectasis or consolidation. Foci of peripheral subpleural heterogeneous airspace opacity in the bilateral lower lobes, the largest foci of which demonstrate some evidence of central clearing, most notably in the anterior segment right lower lobe (series 6, image 87). Upper Abdomen: No acute abnormality. Musculoskeletal: No chest wall abnormality. No acute or significant osseous findings. Review of the MIP images confirms the above findings. IMPRESSION: 1. There is a large burden of extensive bilateral pulmonary embolus in the distal left pulmonary artery as well as all lobar and many more distal segmental to subsegmental branches. 2. Cardiomegaly with enlargement of the RV LV ratio, approximately 1.3, concerning for right heart strain. 3. Multiple foci of peripheral subpleural heterogeneous airspace opacity in the bilateral lower lobes, the largest foci of which demonstrate some evidence of central clearing, most notably  in the anterior segment right lower lobe. Findings are consistent with developing bilateral pulmonary infarctions distal to occlusive embolus. 4. Small bilateral pleural effusions with associated atelectasis or consolidation. These results were called by telephone at the time of interpretation on 04/19/2020 at 2:57 pm to provider PA Doug Sou , who verbally acknowledged these results. Electronically Signed   By: Lauralyn Primes M.D.   On: 04/19/2020 15:01   US Venous Img Lower Bilateral (DVT)  Result Date: 04/19/2020 CLINICAL DATA:  49 year old with bilateral pulmonary emboli. EXAM: BILATERAL LOWER EXTREMITY VENOUS DOPPLER ULTRASOUND TECHNIQUE: Gray-scale sonography with graded compression, as well as color Doppler and duplex ultrasound were performed to evaluate the lower extremity deep venous systems from the level of the common femoral vein and including the common femoral, femoral, profunda femoral, popliteal and calf veins including the posterior tibial, peroneal and gastrocnemius veins when visible. The superficial great saphenous vein was also interrogated. Spectral Doppler was utilized to evaluate flow at rest and with distal augmentation maneuvers in the common femoral, femoral and popliteal veins. COMPARISON:  CT a chest 04/19/2020 FINDINGS: RIGHT LOWER EXTREMITY Common Femoral Vein: No evidence of thrombus. Normal compressibility, color Doppler flow and phasicity. Saphenofemoral Junction: No evidence of thrombus. Normal compressibility and flow on color Doppler imaging. Profunda Femoral Vein: No evidence of thrombus. Normal compressibility and flow on color  Doppler imaging. Femoral Vein: Positive for thrombus. Right femoral vein is not compressible with echogenic thrombus. Evidence for occlusive thrombus in the right femoral vein. Popliteal Vein: Positive for thrombus. Popliteal vein is not compressible and contains echogenic thrombus. Occlusive thrombus in the right popliteal vein. Calf Veins:  Positive for thrombus. Evidence for thrombus involving right posterior tibial and right peroneal veins. Probable thrombus in the right anterior tibial veins. Superficial Great Saphenous Vein: No evidence of thrombus. Normal compressibility. Other Findings:  None. LEFT LOWER EXTREMITY Common Femoral Vein: Positive for thrombus. Nonocclusive thrombus in left common femoral vein. Saphenofemoral Junction: Positive for thrombus. Evidence for thrombus at the left saphenofemoral junction. Profunda Femoral Vein: No evidence of thrombus. Normal color Doppler flow in the left profunda femoral vein. Femoral Vein: Positive for thrombus. Echogenic thrombus in left femoral vein. Occlusive thrombus in the mid and distal left femoral vein. Popliteal Vein: Positive for thrombus. Nonocclusive thrombus in left popliteal vein. Calf Veins: Positive for thrombus. Thrombus involving a left peroneal vein. Thrombus involving left anterior tibial veins. Superficial Great Saphenous Vein: No evidence of thrombus. Normal compressibility. Other Findings:  None. IMPRESSION: 1. Positive for deep venous thrombosis in the right lower extremity. Thrombus involving the right femoral vein, right popliteal vein and right deep calf veins. 2. Positive for deep venous thrombosis in the left lower extremity. Thrombus involving the left common femoral vein, left saphenofemoral junction, left femoral vein, left popliteal vein and left deep calf veins. Electronically Signed   By: Richarda Overlie M.D.   On: 04/19/2020 17:27   ECHOCARDIOGRAM COMPLETE  Result Date: 04/19/2020    ECHOCARDIOGRAM REPORT   Patient Name:   JASAUN CARN Date of Exam: 04/19/2020 Medical Rec #:  [id number]    Height:       65.0 in Accession #:    [id number]   Weight:       155.0 lb Date of Birth:  06-19-1971   BSA:          1.775 m Patient Age:    48 years     BP:           150/112 mmHg Patient Gender: M            HR:           109 bpm. [facility] Procedure: 2D Echo  Indications:    Pulmonary Embolus 415.19 / I26.99  History:        Patient has no prior history of Echocardiogram examinations.                 Risk Factors:Current Smoker. Acute Pulmonary Embolism, Spider                 Bite, no known past medical history.  Sonographer:    Jeryl Columbia RDCS (AE) Referring Phys: [id number] MICHAEL B WERT IMPRESSIONS  1. Left ventricular ejection fraction, by estimation, is 70 to 75%. The left ventricle has hyperdynamic function. The left ventricle has no regional wall motion abnormalities. Left ventricular diastolic parameters are indeterminate.  2. There is ventricular septal flattening consistent with RV pressure overload. . Right ventricular systolic function is normal. The right ventricular size is mildly enlarged.  3. The mitral valve is normal in structure. No evidence of mitral valve regurgitation. No evidence of mitral stenosis.  4. The aortic valve is tricuspid. Aortic valve regurgitation is not visualized. No aortic stenosis is present.  5. Moderate to severe pulmonary HTN, PASP is 62 mmHg.  6. The inferior vena cava is normal in size with greater than 50% respiratory variability, suggesting right atrial pressure of 3 mmHg. FINDINGS  Left Ventricle: Left ventricular ejection fraction, by estimation, is 70 to 75%. The left ventricle has hyperdynamic function. The left ventricle has no regional wall motion abnormalities. The left ventricular internal cavity size was normal in size. There is no left ventricular hypertrophy. Left ventricular diastolic parameters are indeterminate. Right Ventricle: There is ventricular septal flattening consistent with RV pressure overload. The right ventricular size is mildly enlarged. Right vetricular wall thickness was not assessed. Right ventricular systolic function is normal. The tricuspid regurgitant velocity is 3.83 m/s, and with an assumed right atrial pressure of 10 mmHg, the estimated right ventricular systolic pressure is 68.7 mmHg.  Left Atrium: Left atrial size was normal in size. Right Atrium: Right atrial size was normal in size. Pericardium: There is no evidence of pericardial effusion. Mitral Valve: The mitral valve is normal in structure. No evidence of mitral valve regurgitation. No evidence of mitral valve stenosis. Tricuspid Valve: The tricuspid valve is normal in structure. Tricuspid valve regurgitation is mild . No evidence of tricuspid stenosis. Aortic Valve: The aortic valve is tricuspid. Aortic valve regurgitation is not visualized. No aortic stenosis is present. Aortic valve mean gradient measures 3.2 mmHg. Aortic valve peak gradient measures 7.7 mmHg. Aortic valve area, by VTI measures 2.96 cm. Pulmonic Valve: The pulmonic valve was not well visualized. Pulmonic valve regurgitation is not visualized. No evidence of pulmonic stenosis. Aorta: The aortic root is normal in size and structure. Pulmonary Artery: Moderate to severe pulmonary HTN, PASP is 62 mmHg. Venous: The inferior vena cava is normal in size with greater than 50% respiratory variability, suggesting right atrial pressure of 3 mmHg. IAS/Shunts: No atrial level shunt detected by color flow Doppler.  LEFT VENTRICLE PLAX 2D LVIDd:         4.08 cm  Diastology LVIDs:         2.07 cm  LV e' lateral:   8.05 cm/s LV PW:         0.96 cm  LV E/e' lateral: 7.8 LV IVS:        1.00 cm  LV e' medial:    6.85 cm/s LVOT diam:     2.00 cm  LV E/e' medial:  9.1 LV SV:         53 LV SV Index:   30 LVOT Area:     3.14 cm  RIGHT VENTRICLE RV S prime:     16.00 cm/s TAPSE (M-mode): 2.3 cm LEFT ATRIUM           Index       RIGHT ATRIUM           Index LA diam:      2.70 cm 1.52 cm/m  RA Area:     10.70 cm LA Vol (A2C): 13.0 ml 7.32 ml/m  RA Volume:   23.30 ml  13.13 ml/m LA Vol (A4C): 23.6 ml 13.30 ml/m  AORTIC VALVE AV Area (Vmax):    2.13 cm AV Area (Vmean):   2.00 cm AV Area (VTI):     2.96 cm AV Vmax:           138.75 cm/s AV Vmean:          81.412 cm/s AV VTI:             0.178 m AV Peak Grad:      7.7 mmHg AV Mean Grad:  3.2 mmHg LVOT Vmax:         94.10 cm/s LVOT Vmean:        51.764 cm/s LVOT VTI:          0.168 m LVOT/AV VTI ratio: 0.94  AORTA Ao Root diam: 2.90 cm MITRAL VALVE               TRICUSPID VALVE MV Area (PHT): 3.85 cm    TR Peak grad:   58.7 mmHg MV Decel Time: 197 msec    TR Vmax:        383.00 cm/s MV E velocity: 62.60 cm/s MV A velocity: 79.50 cm/s  SHUNTS MV E/A ratio:  0.79        Systemic VTI:  0.17 m                            Systemic Diam: 2.00 cm Dina Rich MD Electronically signed by Dina Rich MD Signature Date/Time: 04/19/2020/4:35:07 PM    Final    CT Renal Stone Study  Result Date: 04/19/2020 CLINICAL DATA:  Left flank pain 1 day. EXAM: CT ABDOMEN AND PELVIS WITHOUT CONTRAST TECHNIQUE: Multidetector CT imaging of the abdomen and pelvis was performed following the standard protocol without IV contrast. COMPARISON:  None. FINDINGS: Lower chest: Small pleural effusions bilaterally. Mild bibasilar patchy airspace disease. Heart size normal. Hepatobiliary: No focal liver abnormality is seen. No gallstones, gallbladder wall thickening, or biliary dilatation. Pancreas: Negative Spleen: Negative Adrenals/Urinary Tract: Adrenal glands are unremarkable. Kidneys are normal, without renal calculi, focal lesion, or hydronephrosis. Mild bladder wall thickening without mass or stone Stomach/Bowel: Mildly dilated small bowel loops in the epigastric region with air-fluid levels. Distal small bowel and colon decompressed. Normal appendix. No bowel wall edema. Vascular/Lymphatic: Mild atherosclerotic calcification. No aortic aneurysm. No enlarged lymph nodes. Reproductive: Moderate to severe prostate enlargement measuring 5.8 x 5.0 x 5.0 cm. Other: No free fluid in the abdomen.  Negative for hernia Musculoskeletal: Mild degenerative changes in the lumbar spine. No acute skeletal abnormality. IMPRESSION: 1. No urinary tract calculi or hydronephrosis. 2.  Dilated small bowel loops epigastric region with air-fluid levels. Probable partial small bowel obstruction. 3. Patchy bibasilar airspace disease which could represent atelectasis or pneumonia. Small bilateral pleural effusions. 4. Mild bladder wall thickening 5. Moderate to severe prostate enlargement. 6.  Aortic Atherosclerosis (ICD10-I70.0). Electronically Signed   By: Marlan Palau M.D.   On: 04/19/2020 13:36     Discharge Exam: Vitals:   04/24/20 0900 04/24/20 1000  BP: 109/76 109/85  Pulse: 83 85  Resp: 19 20  Temp:    SpO2: 95% 95%   Vitals:   04/24/20 0746 04/24/20 0800 04/24/20 0900 04/24/20 1000  BP: 116/77 114/77 109/76 109/85  Pulse: 78 78 83 85  Resp: 17 (!) 25 19 20   Temp:  98 F (36.7 C)    TempSrc:  Oral    SpO2: 96% 97% 95% 95%  Weight:      Height:        General: Pt is alert, awake, not in acute distress Cardiovascular: RRR, S1/S2 +, no rubs, no gallops Respiratory: CTA bilaterally, no wheezing, no rhonchi Abdominal: Soft, NT, ND, bowel sounds + Extremities: no edema, no cyanosis    The results of significant diagnostics from this hospitalization (including imaging, microbiology, ancillary and laboratory) are listed below for reference.     Microbiology: Recent Results (from the past 240 hour(s))  SARS Coronavirus 2 by  RT PCR (hospital order, performed in [facility] hospital lab) Nasopharyngeal Nasopharyngeal Swab     Status: None   Collection Time: 04/19/20  3:15 PM   Specimen: Nasopharyngeal Swab  Result Value Ref Range Status   SARS Coronavirus 2 NEGATIVE NEGATIVE Final    Comment: (NOTE) SARS-CoV-2 target nucleic acids are NOT DETECTED. The SARS-CoV-2 RNA is generally detectable in upper and lower respiratory specimens during the acute phase of infection. The lowest concentration of SARS-CoV-2 viral copies this assay can detect is 250 copies / mL. A negative result does not preclude SARS-CoV-2 infection and should not be used as the sole  basis for treatment or other patient management decisions.  A negative result may occur with improper specimen collection / handling, submission of specimen other than nasopharyngeal swab, presence of viral mutation(s) within the areas targeted by this assay, and inadequate number of viral copies (<250 copies / mL). A negative result must be combined with clinical observations, patient history, and epidemiological information. Fact Sheet for Patients:   BoilerBrush.com.cy Fact Sheet for Healthcare Providers: https://pope.com/ This test is not yet approved or cleared  by the [place] FDA and has been authorized for detection and/or diagnosis of SARS-CoV-2 by FDA under an Emergency Use Authorization (EUA).  This EUA will remain in effect (meaning this test can be used) for the duration of the COVID-19 declaration under Section 564(b)(1) of the Act, 21 U.S.C. section 360bbb-3(b)(1), unless the authorization is terminated or revoked sooner. Performed at [facility], [address], [place], [place] [address]   MRSA PCR Screening     Status: None   Collection Time: 04/19/20  9:36 PM   Specimen: Nasal Mucosa; Nasopharyngeal  Result Value Ref Range Status   MRSA by PCR NEGATIVE NEGATIVE Final    Comment:        The GeneXpert MRSA Assay (FDA approved for NASAL specimens only), is one component of a comprehensive MRSA colonization surveillance program. It is not intended to diagnose MRSA infection nor to guide or monitor treatment for MRSA infections. Performed at [facility], [address] [place], [place] [address]      Labs: BNP (last 3 results) Recent Labs    04/19/20 1050  BNP 14.0   Basic Metabolic Panel: Recent Labs  Lab 04/19/20 1050 04/20/20 0437 04/21/20 0423 04/22/20 0415 04/23/20 0308  NA 133* 133* 134* 134* 136  K 3.6 4.0 3.5 3.8 4.1  CL 98 102 99 99 100  CO2 GLUCOSE 109* 94 98 104*  105*  BUN CREATININE 0.97 0.88 0.82 0.92 0.98  CALCIUM 9.2 8.6* 8.7* 9.1 9.3  MG  --   --  1.8  --   --    Liver Function Tests: Recent Labs  Lab 04/19/20 1050  AST 16  ALT 12  ALKPHOS 53  BILITOT 0.6  PROT 8.5*  ALBUMIN 3.3*   No results for input(s): LIPASE, AMYLASE in the last 168 hours. No results for input(s): AMMONIA in the last 168 hours. CBC: Recent Labs  Lab 04/19/20 1050 04/20/20 0437 04/20/20 0810 04/21/20 0423 04/22/20 0415 04/23/20 0308 04/24/20 0346  WBC 12.7*   < > 11.1* 9.3 8.1 7.1 7.4  NEUTROABS 9.2*  --   --   --   --   --   --   HGB 15.5   < > 13.1 12.9* 13.2 13.5 13.9  HCT 48.0   < > 39.9 40.2 41.1  42.2 43.2  MCV 88.9   < > 88.7 88.7 87.8 88.1 89.3  PLT 229   < > 201 238 282 295 350   < > = values in this interval not displayed.   Cardiac Enzymes: No results for input(s): CKTOTAL, CKMB, CKMBINDEX, TROPONINI in the last 168 hours. BNP: Invalid input(s): POCBNP CBG: No results for input(s): GLUCAP in the last 168 hours. D-Dimer No results for input(s): DDIMER in the last 72 hours. Hgb A1c No results for input(s): HGBA1C in the last 72 hours. Lipid Profile No results for input(s): CHOL, HDL, LDLCALC, TRIG, CHOLHDL, LDLDIRECT in the last 72 hours. Thyroid function studies No results for input(s): TSH, T4TOTAL, T3FREE, THYROIDAB in the last 72 hours.  Invalid input(s): FREET3 Anemia work up No results for input(s): VITAMINB12, FOLATE, FERRITIN, TIBC, IRON, RETICCTPCT in the last 72 hours. Urinalysis    Component Value Date/Time   COLORURINE YELLOW 04/19/2020 1641   APPEARANCEUR CLEAR 04/19/2020 1641   LABSPEC >1.046 (H) 04/19/2020 1641   PHURINE 5.0 04/19/2020 1641   GLUCOSEU NEGATIVE 04/19/2020 1641   HGBUR SMALL (A) 04/19/2020 1641   BILIRUBINUR NEGATIVE 04/19/2020 1641   KETONESUR 20 (A) 04/19/2020 1641   PROTEINUR NEGATIVE 04/19/2020 1641   NITRITE NEGATIVE 04/19/2020 1641   LEUKOCYTESUR NEGATIVE 04/19/2020 1641    Sepsis Labs Invalid input(s): PROCALCITONIN,  WBC,  LACTICIDVEN Microbiology Recent Results (from the past 240 hour(s))  SARS Coronavirus 2 by RT PCR (hospital order, performed in [facility] hospital lab) Nasopharyngeal Nasopharyngeal Swab     Status: None   Collection Time: 04/19/20  3:15 PM   Specimen: Nasopharyngeal Swab  Result Value Ref Range Status   SARS Coronavirus 2 NEGATIVE NEGATIVE Final    Comment: (NOTE) SARS-CoV-2 target nucleic acids are NOT DETECTED. The SARS-CoV-2 RNA is generally detectable in upper and lower respiratory specimens during the acute phase of infection. The lowest concentration of SARS-CoV-2 viral copies this assay can detect is 250 copies / mL. A negative result does not preclude SARS-CoV-2 infection and should not be used as the sole basis for treatment or other patient management decisions.  A negative result may occur with improper specimen collection / handling, submission of specimen other than nasopharyngeal swab, presence of viral mutation(s) within the areas targeted by this assay, and inadequate number of viral copies (<250 copies / mL). A negative result must be combined with clinical observations, patient history, and epidemiological information. Fact Sheet for Patients:   BoilerBrush.com.cy Fact Sheet for Healthcare Providers: https://pope.com/ This test is not yet approved or cleared  by the [place] FDA and has been authorized for detection and/or diagnosis of SARS-CoV-2 by FDA under an Emergency Use Authorization (EUA).  This EUA will remain in effect (meaning this test can be used) for the duration of the COVID-19 declaration under Section 564(b)(1) of the Act, 21 U.S.C. section 360bbb-3(b)(1), unless the authorization is terminated or revoked sooner. Performed at [facility], [address] [place], [place] [address]   MRSA PCR Screening     Status: None   Collection Time:  04/19/20  9:36 PM   Specimen: Nasal Mucosa; Nasopharyngeal  Result Value Ref Range Status   MRSA by PCR NEGATIVE NEGATIVE Final    Comment:        The GeneXpert MRSA Assay (FDA approved for NASAL specimens only), is one component of a comprehensive MRSA colonization surveillance program. It is not intended to diagnose MRSA infection nor to guide or monitor treatment for MRSA infections.  Performed at [facility], [address] [place], [place] [id number]      Time coordinating discharge: 35 minutes  SIGNED:   Erick BlinksPratik D Bryant Lipps, DO [facility] 04/24/2020, 10:47 AM  If 7PM-7AM, please contact night-coverage [email or web address]

## 2020-04-24 NOTE — Progress Notes (Signed)
Patient to be discharged home and in stable condition. Patient's IV and telemetry removed, WNL. Patient given discharge instructions and verbalized understanding. Patient has resources at beside for PCP and Eliquis voucher. Patient to be escorted out by staff via wheelchair upon arrival of transportation.  Quita Skye, RN

## 2020-09-03 ENCOUNTER — Emergency Department (HOSPITAL_COMMUNITY): Payer: Self-pay

## 2020-09-03 ENCOUNTER — Inpatient Hospital Stay (HOSPITAL_COMMUNITY)
Admission: EM | Admit: 2020-09-03 | Discharge: 2020-09-06 | DRG: 175 | Disposition: A | Discharge status: Home / Self Care | Payer: Self-pay | Attending: Family Medicine | Admitting: Family Medicine

## 2020-09-03 ENCOUNTER — Encounter (HOSPITAL_COMMUNITY): Payer: Self-pay | Admitting: Emergency Medicine

## 2020-09-03 ENCOUNTER — Other Ambulatory Visit: Payer: Self-pay

## 2020-09-03 DIAGNOSIS — I82452 Acute embolism and thrombosis of left peroneal vein: Secondary | ICD-10-CM | POA: Diagnosis present

## 2020-09-03 DIAGNOSIS — Z91030 Bee allergy status: Secondary | ICD-10-CM

## 2020-09-03 DIAGNOSIS — Z91018 Allergy to other foods: Secondary | ICD-10-CM

## 2020-09-03 DIAGNOSIS — Z9114 Patient's other noncompliance with medication regimen: Secondary | ICD-10-CM

## 2020-09-03 DIAGNOSIS — F1010 Alcohol abuse, uncomplicated: Secondary | ICD-10-CM | POA: Diagnosis present

## 2020-09-03 DIAGNOSIS — J9601 Acute respiratory failure with hypoxia: Secondary | ICD-10-CM | POA: Diagnosis present

## 2020-09-03 DIAGNOSIS — I82403 Acute embolism and thrombosis of unspecified deep veins of lower extremity, bilateral: Secondary | ICD-10-CM | POA: Diagnosis present

## 2020-09-03 DIAGNOSIS — I82413 Acute embolism and thrombosis of femoral vein, bilateral: Secondary | ICD-10-CM | POA: Diagnosis present

## 2020-09-03 DIAGNOSIS — Z833 Family history of diabetes mellitus: Secondary | ICD-10-CM

## 2020-09-03 DIAGNOSIS — I2694 Multiple subsegmental pulmonary emboli without acute cor pulmonale: Secondary | ICD-10-CM

## 2020-09-03 DIAGNOSIS — I2699 Other pulmonary embolism without acute cor pulmonale: Principal | ICD-10-CM | POA: Diagnosis present

## 2020-09-03 DIAGNOSIS — J189 Pneumonia, unspecified organism: Secondary | ICD-10-CM | POA: Diagnosis present

## 2020-09-03 DIAGNOSIS — Z86711 Personal history of pulmonary embolism: Secondary | ICD-10-CM

## 2020-09-03 DIAGNOSIS — D72829 Elevated white blood cell count, unspecified: Secondary | ICD-10-CM | POA: Diagnosis present

## 2020-09-03 DIAGNOSIS — Z20822 Contact with and (suspected) exposure to covid-19: Secondary | ICD-10-CM | POA: Diagnosis present

## 2020-09-03 DIAGNOSIS — I82433 Acute embolism and thrombosis of popliteal vein, bilateral: Secondary | ICD-10-CM | POA: Diagnosis present

## 2020-09-03 DIAGNOSIS — F1910 Other psychoactive substance abuse, uncomplicated: Secondary | ICD-10-CM | POA: Diagnosis present

## 2020-09-03 DIAGNOSIS — F17210 Nicotine dependence, cigarettes, uncomplicated: Secondary | ICD-10-CM | POA: Diagnosis present

## 2020-09-03 HISTORY — DX: Acute embolism and thrombosis of unspecified deep veins of unspecified lower extremity: I82.409

## 2020-09-03 LAB — BASIC METABOLIC PANEL
Anion gap: 14 (ref 5–15)
BUN: 10 mg/dL (ref 6–20)
CO2: 24 mmol/L (ref 22–32)
Calcium: 9.9 mg/dL (ref 8.9–10.3)
Chloride: 94 mmol/L — ABNORMAL LOW (ref 98–111)
Creatinine, Ser: 1.08 mg/dL (ref 0.61–1.24)
GFR, Estimated: >60 mL/min (ref >60)
Glucose, Bld: 118 mg/dL — ABNORMAL HIGH (ref 70–99)
Potassium: 3.7 mmol/L (ref 3.5–5.1)
Sodium: 132 mmol/L — ABNORMAL LOW (ref 135–145)

## 2020-09-03 LAB — CBC WITH DIFFERENTIAL/PLATELET
Abs Immature Granulocytes: 0.08 10*3/uL — ABNORMAL HIGH (ref 0.00–0.07)
Basophils Absolute: 0.1 10*3/uL (ref 0.0–0.1)
Basophils Relative: 0 %
Eosinophils Absolute: 0 10*3/uL (ref 0.0–0.5)
Eosinophils Relative: 0 %
HCT: 44.6 % (ref 39.0–52.0)
Hemoglobin: 15.1 g/dL (ref 13.0–17.0)
Immature Granulocytes: 1 %
Lymphocytes Relative: 7 %
Lymphs Abs: 1.2 10*3/uL (ref 0.7–4.0)
MCH: 30 pg (ref 26.0–34.0)
MCHC: 33.9 g/dL (ref 30.0–36.0)
MCV: 88.7 fL (ref 80.0–100.0)
Monocytes Absolute: 0.7 10*3/uL (ref 0.1–1.0)
Monocytes Relative: 5 %
Neutro Abs: 14.5 10*3/uL — ABNORMAL HIGH (ref 1.7–7.7)
Neutrophils Relative %: 87 %
Platelets: 207 10*3/uL (ref 150–400)
RBC: 5.03 MIL/uL (ref 4.22–5.81)
RDW: 15.7 % — ABNORMAL HIGH (ref 11.5–15.5)
WBC: 16.6 10*3/uL — ABNORMAL HIGH (ref 4.0–10.5)
nRBC: 0 % (ref 0.0–0.2)

## 2020-09-03 LAB — TROPONIN I (HIGH SENSITIVITY)
Troponin I (High Sensitivity): 3 ng/L (ref <18)
Troponin I (High Sensitivity): 4 ng/L (ref <18)

## 2020-09-03 LAB — ETHANOL: Alcohol, Ethyl (B): <10 mg/dL (ref <10)

## 2020-09-03 LAB — RESP PANEL BY RT PCR (RSV, FLU A&B, COVID)
Influenza A by PCR: NEGATIVE
Influenza B by PCR: NEGATIVE
Respiratory Syncytial Virus by PCR: NEGATIVE
SARS Coronavirus 2 by RT PCR: NEGATIVE

## 2020-09-03 LAB — RAPID URINE DRUG SCREEN, HOSP PERFORMED
Amphetamines: NOT DETECTED
Barbiturates: NOT DETECTED
Benzodiazepines: NOT DETECTED
Cocaine: POSITIVE — AB
Opiates: NOT DETECTED
Tetrahydrocannabinol: POSITIVE — AB

## 2020-09-03 LAB — HEPARIN LEVEL (UNFRACTIONATED): Heparin Unfractionated: <0.1 IU/mL — ABNORMAL LOW (ref 0.30–0.70)

## 2020-09-03 MED ORDER — SODIUM CHLORIDE 0.9 % IV SOLN
2.0000 g | INTRAVENOUS | Status: DC
  Administered 2020-09-04 – 2020-09-05 (×2): 2 g via INTRAVENOUS
  Filled 2020-09-03 (×2): qty 20

## 2020-09-03 MED ORDER — HEPARIN BOLUS VIA INFUSION: 3000.0000 [IU] | Freq: Once | INTRAVENOUS | Status: DC

## 2020-09-03 MED ORDER — IOHEXOL 350 MG/ML SOLN
100.0000 mL | Freq: Once | INTRAVENOUS | Status: AC | PRN
  Administered 2020-09-03: 100 mL via INTRAVENOUS

## 2020-09-03 MED ORDER — LORAZEPAM 2 MG/ML IJ SOLN
0.0000 mg | Freq: Four times a day (QID) | INTRAMUSCULAR | Status: DC
  Administered 2020-09-03: 1 mg via INTRAVENOUS
  Filled 2020-09-03: qty 1

## 2020-09-03 MED ORDER — OXYCODONE HCL 5 MG PO TABS
10.0000 mg | ORAL_TABLET | Freq: Four times a day (QID) | ORAL | Status: DC | PRN
  Administered 2020-09-04 – 2020-09-06 (×4): 10 mg via ORAL
  Filled 2020-09-03 (×4): qty 2

## 2020-09-03 MED ORDER — LORAZEPAM 1 MG PO TABS: 1.0000 mg | ORAL_TABLET | ORAL | Status: DC | PRN

## 2020-09-03 MED ORDER — LORAZEPAM 2 MG/ML IJ SOLN: 0.0000 mg | Freq: Two times a day (BID) | INTRAMUSCULAR | Status: DC

## 2020-09-03 MED ORDER — SODIUM CHLORIDE 0.9 % IV SOLN: 500.0000 mg | INTRAVENOUS | Status: DC

## 2020-09-03 MED ORDER — FOLIC ACID 1 MG PO TABS
1.0000 mg | ORAL_TABLET | Freq: Every day | ORAL | Status: DC
  Administered 2020-09-04 – 2020-09-06 (×3): 1 mg via ORAL
  Filled 2020-09-03 (×3): qty 1

## 2020-09-03 MED ORDER — HEPARIN BOLUS VIA INFUSION
5000.0000 [IU] | Freq: Once | INTRAVENOUS | Status: AC
  Administered 2020-09-03: 5000 [IU] via INTRAVENOUS

## 2020-09-03 MED ORDER — SODIUM CHLORIDE 0.9 % IV SOLN
1.0000 g | Freq: Once | INTRAVENOUS | Status: AC
  Administered 2020-09-03: 1 g via INTRAVENOUS
  Filled 2020-09-03: qty 10

## 2020-09-03 MED ORDER — SODIUM CHLORIDE 0.9 % IV SOLN
500.0000 mg | Freq: Once | INTRAVENOUS | Status: AC
  Administered 2020-09-03: 500 mg via INTRAVENOUS
  Filled 2020-09-03: qty 500

## 2020-09-03 MED ORDER — THIAMINE HCL 100 MG/ML IJ SOLN: 100.0000 mg | Freq: Every day | INTRAMUSCULAR | Status: DC

## 2020-09-03 MED ORDER — THIAMINE HCL 100 MG PO TABS
100.0000 mg | ORAL_TABLET | Freq: Every day | ORAL | Status: DC
  Administered 2020-09-04 – 2020-09-06 (×3): 100 mg via ORAL
  Filled 2020-09-03 (×3): qty 1

## 2020-09-03 MED ORDER — ADULT MULTIVITAMIN W/MINERALS CH
1.0000 | ORAL_TABLET | Freq: Every day | ORAL | Status: DC
  Administered 2020-09-04 – 2020-09-06 (×3): 1 via ORAL
  Filled 2020-09-03 (×3): qty 1

## 2020-09-03 MED ORDER — HEPARIN (PORCINE) 25000 UT/250ML-% IV SOLN
1600.0000 [IU]/h | INTRAVENOUS | Status: DC
  Administered 2020-09-03 – 2020-09-05 (×3): 1300 [IU]/h via INTRAVENOUS
  Administered 2020-09-05: 1600 [IU]/h via INTRAVENOUS
  Filled 2020-09-03 (×4): qty 250

## 2020-09-03 MED ORDER — LORAZEPAM 2 MG/ML IJ SOLN: 1.0000 mg | INTRAMUSCULAR | Status: DC | PRN

## 2020-09-03 NOTE — ED Provider Notes (Signed)
[facility] EMERGENCY DEPARTMENT Provider Note   CSN: [id number] Arrival date & time: 09/03/20  1809     History Chief Complaint  Patient presents with  . Claudication    Terrence Christian is a 49 y.o. male with a history of DVT and pulmonary embolism having been admitted for this 4 months ago presenting for evaluation of rather sudden onset of pain and swelling in his left thigh which started today.  He had been taking Eliquis since his discharge from here until he ran out of this medication 5 days ago.  He describes constant sharp pain in the thigh also numbness.  He noticed feeling slightly short of breath starting yesterday, denies orthopnea, pleuritic chest pain, also denies cough, fever or chills.  He has had no treatment prior to arrival for his symptoms.    The history is provided by the patient.       Past Medical History:  Diagnosis Date  . DVT (deep venous thrombosis) [facility](HCC)     Patient Active Problem List   Diagnosis Date Noted  . Pulmonary emboli (HCC) 09/03/2020  . 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 Asthma 04/19/2020    History reviewed. No pertinent surgical history.     Family History  Problem Relation Age of Onset  . Diabetes Brother     Social History   Tobacco Use  . Smoking status: Current Every Day Smoker    Packs/day: 0.50    Years: 20.00    Pack years: 10.00    Types: Cigarettes  . Smokeless tobacco: Never Used  Vaping Use  . Vaping Use: Never assessed  Substance Use Topics  . Alcohol use: No  . Drug use: No    Home Medications Prior to Admission medications   Medication Sig Start Date End Date Taking? Authorizing Provider  apixaban (ELIQUIS) 5 MG TABS tablet Take 1 tablet (5 mg total) by mouth 2 (two) times daily. 05/01/20 09/03/20 Yes Shah, Pratik D, DO  apixaban (ELIQUIS) 5 MG TABS tablet Take 2 tablets (10 mg total) by mouth 2 (two) times daily for 7 days. 04/24/20 05/01/20  Sherryll BurgerShah, Pratik D, DO  oxyCODONE 10 MG TABS  Take 1 tablet (10 mg total) by mouth every 6 (six) hours as needed for moderate pain, severe pain or breakthrough pain. 04/24/20   Sherryll BurgerShah, Pratik D, DO    Allergies    Bee venom, Banana, and Other  Review of Systems   Review of Systems  Constitutional: Negative for chills and fever.  HENT: Negative for congestion and sore throat.   Eyes: Negative.   Respiratory: Positive for shortness of breath. Negative for chest tightness.   Cardiovascular: Positive for leg swelling. Negative for chest pain.  Gastrointestinal: Negative for abdominal pain and nausea.  Genitourinary: Negative.   Musculoskeletal: Positive for arthralgias. Negative for joint swelling and neck pain.  Skin: Negative.  Negative for rash and wound.  Neurological: Negative for dizziness, weakness, light-headedness, numbness and headaches.  Psychiatric/Behavioral: Negative.   All other systems reviewed and are negative.   Physical Exam Updated Vital Signs BP 120/78   Pulse (!) 123   Temp 99.8 F (37.7 C) (Oral)   Resp (!) 36   Ht 5\' 5"  (1.651 m)   Wt 74.8 kg   SpO2 95%   BMI 27.46 kg/m   Physical Exam Vitals and nursing note reviewed.  Constitutional:      Appearance: He is well-developed.  HENT:     Head: Normocephalic and atraumatic.  Eyes:     Conjunctiva/sclera: Conjunctivae normal.  Cardiovascular:     Rate and Rhythm: Regular rhythm. Tachycardia present.     Pulses:          Dorsalis pedis pulses are 2+ on the right side and 2+ on the left side.     Heart sounds: Normal heart sounds.  Pulmonary:     Effort: Respiratory distress present.     Breath sounds: Normal breath sounds. No wheezing, rhonchi or rales.     Comments: tachypnea Chest:     Chest wall: No tenderness.  Abdominal:     General: Bowel sounds are normal.     Palpations: Abdomen is soft.     Tenderness: There is no abdominal tenderness.  Musculoskeletal:        General: Tenderness present. Normal range of motion.     Cervical back:  Normal range of motion.     Comments: Tenderness left anterior thigh without palpable cords or induration.    Skin:    General: Skin is warm and dry.  Neurological:     Mental Status: He is alert.     ED Results / Procedures / Treatments   Labs (all labs ordered are listed, but only abnormal results are displayed) Labs Reviewed  CBC WITH DIFFERENTIAL/PLATELET - Abnormal; Notable for the following components:      Result Value   WBC 16.6 (*)    RDW 15.7 (*)    Neutro Abs 14.5 (*)    Abs Immature Granulocytes 0.08 (*)    All other components within normal limits  BASIC METABOLIC PANEL - Abnormal; Notable for the following components:   Sodium 132 (*)    Chloride 94 (*)    Glucose, Bld 118 (*)    All other components within normal limits  RAPID URINE DRUG SCREEN, HOSP PERFORMED - Abnormal; Notable for the following components:   Cocaine POSITIVE (*)    Tetrahydrocannabinol POSITIVE (*)    All other components within normal limits  RESP PANEL BY RT PCR (RSV, FLU A&B, COVID)  ETHANOL  HEPARIN LEVEL (UNFRACTIONATED)  HEPARIN LEVEL (UNFRACTIONATED)  CBC  TROPONIN I (HIGH SENSITIVITY)  TROPONIN I (HIGH SENSITIVITY)    EKG EKG Interpretation  Date/Time:  Sunday September 03 2020 19:06:29 EDT Ventricular Rate:  125 PR Interval:    QRS Duration: 94 QT Interval:  299 QTC Calculation: 432 R Axis:   63 Text Interpretation: Sinus tachycardia Probable left atrial enlargement Confirmed by Pricilla Loveless [phone]) on 09/03/2020 8:52:33 PM   Radiology CT Angio Chest PE W and/or Wo Contrast  Addendum Date: 09/03/2020   ADDENDUM REPORT: 09/03/2020 20:55 ADDENDUM: Critical Value/emergent results were called by telephone at the time of interpretation on 09/03/2020 at 8:55 pm to Dr. Criss Alvine in the ED, who verbally acknowledged these results. Electronically Signed   By: Odessa Fleming M.D.   On: 09/03/2020 20:55   Result Date: 09/03/2020 CLINICAL DATA:  49 year old male with left lower  extremity DVT and PE diagnosed in June, ran out of Eliquis recently. Increased pain and numbness. EXAM: CT ANGIOGRAPHY CHEST WITH CONTRAST TECHNIQUE: Multidetector CT imaging of the chest was performed using the standard protocol during bolus administration of intravenous contrast. Multiplanar CT image reconstructions and MIPs were obtained to evaluate the vascular anatomy. CONTRAST:  OMNIPAQUE IOHEXOL 350 MG/ML SOLN COMPARISON:  CTA chest 04/19/2020.  Portable chest x-ray tonight. FINDINGS: Cardiovascular: Suboptimal contrast bolus timing in the pulmonary arterial tree. Improved bilateral pulmonary artery patency since June although  bilateral lobar and segmental pulmonary thrombus persists (left lower lobe series 5, image 119, left upper lobe image 99, right lower lobe image 133. Much of this appears nonocclusive now. There is no saddle embolus. Heart size is stable. No pericardial effusion. Negative visible aorta. Mediastinum/Nodes: Mediastinal lymph nodes remain within normal limits. Lungs/Pleura: Major airways are patent and lung volumes are similar to June. Small pleural effusions have regressed. However, there is new symmetric bilateral confluent pulmonary ground-glass opacity with an unusual pattern sparing the subpleural lung parenchyma bilaterally (series 6, image 45, 69). Symmetric involvement of the superior lower lobe segments, but the lung bases are relatively spared. Upper Abdomen: Negative visible liver, gallbladder, spleen, pancreas, adrenal glands, kidneys and bowel in the upper abdomen. Musculoskeletal: Stable thoracic endplate degeneration. No acute osseous abnormality identified. Review of the MIP images confirms the above findings. IMPRESSION: 1. Regressed but not resolved bilateral pulmonary emboli since June. Residual bilateral lobar and segmental thrombus which mostly appears nonocclusive today. This could be incompletely resolved clot versus recurrent Acute PE. 2. New symmetric and  confluent pulmonary ground-glass opacity but with an unusual pattern sparing of the subpleural lung and lung bases bilaterally. No pleural effusion. Differential considerations include viral and atypical respiratory infection (including mycoplasma, adenovirus, influenza), but also noninfectious inflammation such as pulmonary alveolar proteinosis, hypersensitivity pneumonitis. Recommend excluding COVID-19. Electronically Signed: By: Odessa Fleming M.D. On: 09/03/2020 20:47   DG Chest Port 1 View  Result Date: 09/03/2020 CLINICAL DATA:  Shortness of breath EXAM: PORTABLE CHEST 1 VIEW COMPARISON:  CT 04/19/2020 FINDINGS: Heart and mediastinal contours are within normal limits. No focal opacities or effusions. No acute bony abnormality. IMPRESSION: No active disease. Electronically Signed   By: Charlett Nose M.D.   On: 09/03/2020 19:49    Procedures Procedures (including critical care time)   CRITICAL CARE Performed by: Burgess Amor Total critical care time: 40 minutes Critical care time was exclusive of separately billable procedures and treating other patients. Critical care was necessary to treat or prevent imminent or life-threatening deterioration. Critical care was time spent personally by me on the following activities: development of treatment plan with patient and/or surrogate as well as nursing, discussions with consultants, evaluation of patient's response to treatment, examination of patient, obtaining history from patient or surrogate, ordering and performing treatments and interventions, ordering and review of laboratory studies, ordering and review of radiographic studies, pulse oximetry and re-evaluation of patient's condition.   Medications Ordered in ED Medications  cefTRIAXone (ROCEPHIN) 1 g in sodium chloride 0.9 % 100 mL IVPB (1 g Intravenous New Bag/Given 09/03/20 2205)  azithromycin (ZITHROMAX) 500 mg in sodium chloride 0.9 % 250 mL IVPB (has no administration in time range)  heparin  bolus via infusion 5,000 Units (has no administration in time range)  heparin ADULT infusion 100 units/mL (25000 units/229mL sodium chloride 0.45%) (has no administration in time range)  iohexol (OMNIPAQUE) 350 MG/ML injection 100 mL (100 mLs Intravenous Contrast Given 09/03/20 2018)    ED Course  I have reviewed the triage vital signs and the nursing notes.  Pertinent labs & imaging results that were available during my care of the patient were reviewed by me and considered in my medical decision making (see chart for details).    MDM Rules/Calculators/A&P                          Patient with a history of DVT and pulmonary embolism presenting with suspected reoccurrence given  acute new onset symptoms after running out of his Eliquis.  X-ray also suggest possible atypical pulmonary infection.  His Covid screen is currently pending.  He will require admission given these findings.  He was given heparin per pharmacy consult, also added Rocephin and Zithromax.  Call placed to Dr. Carren Rang who accepts patient for admission. Final Clinical Impression(s) / ED Diagnoses Final diagnoses:  Pulmonary embolism without acute cor pulmonale, unspecified chronicity, unspecified pulmonary embolism type [facility])    Rx / DC Orders ED Discharge Orders    None       Victoriano Lain 09/03/20 2211    Pricilla Loveless, MD 09/04/20 [phone]

## 2020-09-03 NOTE — H&P (Signed)
TRH H&P    Patient Demographics:    Terrence Christian, is a 49 y.o. male  MRN: [id number]  DOB - 09-25-1971  Admit Date - 09/03/2020  Referring MD/NP/PA: Lincoln Maxin  Outpatient Primary MD for the patient is Patient, [name]  Patient coming from: Home  Chief complaint-dyspnea   HPI:    Terrence Christian  is a 50 y.o. male, with history of DVT and PE presents to the ED today with a chief complaint of sudden onset shortness of breath.  Patient reports that it started yesterday, was sudden in onset, and has been progressively worse.  Patient reports that it is nonexertional, he has no chest pain, he has no palpitations, and he has not felt lightheaded.  Patient does report subjective chills and fever at home.  Patient reports no change in his normal cough.  Patient is a current smoker of half a pack per day per his report.  Patient was recently admitted in June for pulmonary emboli.  At that time he had an echo that showed 70 to 75% ejection fraction with ventricular septal flattening consistent with right ventricle pressure overload.  Patient was sent home on Eliquis.  Patient reports that he ran out of Eliquis 1 week ago.  Patient is not vaccinated for Covid.  Patient is full code.  Of note, patient does report that he drinks a sixpack every day.  He reports that he is never had tremors or DTs when he goes a couple days without alcohol. Patient has positive marijuana and cocaine, reports last use was this morning.  In the ED Temperature nine 9.8, heart rate 123, respiratory rate 34, blood pressure 150/122 satting at 97% on 2 L nasal cannula White blood cell count 16.6 Panel is unremarkable Negative Covid and flu Alcohol level is less than 10 Positive cocaine and THC in UDS CTA shows pulmonary emboli and signs consistent with infection Chest x-ray shows no active disease Heparin drip started EKG shows sinus  tach Admission requested for further management of residual/possibly recurrence pulmonary emboli    Review of systems:    In addition to the HPI above,  No Fever-chills, No Headache, No changes with Vision or hearing, No problems swallowing food or Liquids, No Chest pain, Cough sudden onset shortness of Breath, No Abdominal pain, No Nausea or Vomiting, bowel movements are regular, does report decreased appetite No Blood in stool or Urine, No dysuria, No new skin rashes or bruises, No new joints pains-aches,  No new weakness, tingling, numbness in any extremity, No recent weight gain or loss, No polyuria, polydypsia or polyphagia, No significant Mental Stressors.  All other systems reviewed and are negative.    Past History of the following :    Past Medical History:  Diagnosis Date  . DVT (deep venous thrombosis) (HCC)       History reviewed. No pertinent surgical history.    Social History:      Social History   Tobacco Use  . Smoking status: Current Every Day Smoker    Packs/day: 0.50  Years: 20.00    Pack years: 10.00    Types: Cigarettes  . Smokeless tobacco: Never Used  Substance Use Topics  . Alcohol use: No       Family History :     Family History  Problem Relation Age of Onset  . Diabetes Brother       Home Medications:   Prior to Admission medications   Medication Sig Start Date End Date Taking? Authorizing Provider  apixaban (ELIQUIS) 5 MG TABS tablet Take 1 tablet (5 mg total) by mouth 2 (two) times daily. 05/01/20 09/03/20 Yes Shah, Pratik D, DO  apixaban (ELIQUIS) 5 MG TABS tablet Take 2 tablets (10 mg total) by mouth 2 (two) times daily for 7 days. 04/24/20 05/01/20  Sherryll Burger, Pratik D, DO  oxyCODONE 10 MG TABS Take 1 tablet (10 mg total) by mouth every 6 (six) hours as needed for moderate pain, severe pain or breakthrough pain. 04/24/20   Maurilio Lovely D, DO     Allergies:     Allergies  Allergen Reactions  . Bee Venom Anaphylaxis  .  Banana   . 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     Physical Exam:   Vitals  Blood pressure 120/78, pulse (!) 123, temperature 99.8 F (37.7 C), temperature source Oral, resp. rate (!) 36, height 5\' 5"  (1.651 m), weight 74.8 kg, SpO2 95 %.  1.  General: Lying supine in bed, no acute distress, quite drowsy  2. Psychiatric: Mood and behavior normal for situation Patient is drowsy but oriented x3  3. Neurologic: Cranial nerves II through XII are intact moves all 4 extremities voluntarily no focal deficit on limited exam  4. HEENMT:  Head is atraumatic, normocephalic, pupils reactive to light, neck is supple, trachea is midline  5. Respiratory : Coarse breath sounds on the right compared to left No wheezes or rales, no cyanosis or clubbing  6. Cardiovascular : Heart rate is tachycardic, rhythm is regular, no murmurs rubs or gallops  7. Gastrointestinal:  Abdomen is soft, nondistended, nontender to palpation  8. Skin:  No acute lesion on limited skin exam  9.Musculoskeletal:  Both lower extremities are tender, asymmetric edema on left compared to right lower extremity, also more tender on left calf    Data Review:    CBC Recent Labs  Lab 09/03/20 1913  WBC 16.6*  HGB 15.1  HCT 44.6  PLT 207  MCV 88.7  MCH 30.0  MCHC 33.9  RDW 15.7*  LYMPHSABS 1.2  MONOABS 0.7  EOSABS 0.0  BASOSABS 0.1   ------------------------------------------------------------------------------------------------------------------  Results for orders placed or performed during the hospital encounter of 09/03/20 (from the past 48 hour(s))  CBC with Differential/Platelet     Status: Abnormal   Collection Time: 09/03/20  7:13 PM  Result Value Ref Range   WBC 16.6 (H) 4.0 - 10.5 K/uL   RBC 5.03 4.22 - 5.81 MIL/uL   Hemoglobin 15.1 13.0 - 17.0 g/dL   HCT 09/05/20 39 - 52 %   MCV 88.7 80.0 - 100.0 fL   MCH 30.0 26.0 - 34.0 pg   MCHC 33.9 30.0 - 36.0 g/dL   RDW 22.9 (H) 79.8 - 92.1 %   Platelets  207 150 - 400 K/uL   nRBC 0.0 0.0 - 0.2 %   Neutrophils Relative % 87 %   Neutro Abs 14.5 (H) 1.7 - 7.7 K/uL   Lymphocytes Relative 7 %   Lymphs Abs 1.2 0.7 - 4.0 K/uL  Monocytes Relative 5 %   Monocytes Absolute 0.7 0.1 - 1.0 K/uL   Eosinophils Relative 0 %   Eosinophils Absolute 0.0 0.0 - 0.5 K/uL   Basophils Relative 0 %   Basophils Absolute 0.1 0.0 - 0.1 K/uL   Immature Granulocytes 1 %   Abs Immature Granulocytes 0.08 (H) 0.00 - 0.07 K/uL    Comment: Performed at [facility], [address] [place], [place] [address]  Troponin I (High Sensitivity)     Status: None   Collection Time: 09/03/20  7:13 PM  Result Value Ref Range   Troponin I (High Sensitivity) 3 <18 ng/L    Comment: (NOTE) Elevated high sensitivity troponin I (hsTnI) values and significant  changes across serial measurements may suggest ACS but many other  chronic and acute conditions are known to elevate hsTnI results.  Refer to the "Links" section for chest pain algorithms and additional  guidance. Performed at [facility], [address] [place], [place] [address]   Basic metabolic panel     Status: Abnormal   Collection Time: 09/03/20  7:13 PM  Result Value Ref Range   Sodium 132 (L) 135 - 145 mmol/L   Potassium 3.7 3.5 - 5.1 mmol/L   Chloride 94 (L) 98 - 111 mmol/L   CO2 24 22 - 32 mmol/L   Glucose, Bld 118 (H) 70 - 99 mg/dL    Comment: Glucose reference range applies only to samples taken after fasting for at least 8 hours.   BUN 10 6 - 20 mg/dL   Creatinine, Ser 0.98 0.61 - 1.24 mg/dL   Calcium 9.9 8.9 - 11.9 mg/dL   GFR, Estimated >14 >78 mL/min   Anion gap 14 5 - 15    Comment: Performed at [facility], [address] [place], [place] [address]  Resp Panel by RT PCR (RSV, Flu A&B, Covid) - Nasopharyngeal Swab     Status: None   Collection Time: 09/03/20  7:13 PM   Specimen: Nasopharyngeal Swab  Result Value Ref Range   SARS Coronavirus 2 by RT PCR NEGATIVE NEGATIVE    Comment:  (NOTE) SARS-CoV-2 target nucleic acids are NOT DETECTED.  The SARS-CoV-2 RNA is generally detectable in upper respiratoy specimens during the acute phase of infection. The lowest concentration of SARS-CoV-2 viral copies this assay can detect is 131 copies/mL. A negative result does not preclude SARS-Cov-2 infection and should not be used as the sole basis for treatment or other patient management decisions. A negative result may occur with  improper specimen collection/handling, submission of specimen other than nasopharyngeal swab, presence of viral mutation(s) within the areas targeted by this assay, and inadequate number of viral copies (<131 copies/mL). A negative result must be combined with clinical observations, patient history, and epidemiological information. The expected result is Negative.  Fact Sheet for Patients:  https://www.moore.com/  Fact Sheet for Healthcare Providers:  https://www.young.biz/  This test is no t yet approved or cleared by the [place] FDA and  has been authorized for detection and/or diagnosis of SARS-CoV-2 by FDA under an Emergency Use Authorization (EUA). This EUA will remain  in effect (meaning this test can be used) for the duration of the COVID-19 declaration under Section 564(b)(1) of the Act, 21 U.S.C. section 360bbb-3(b)(1), unless the authorization is terminated or revoked sooner.     Influenza A by PCR NEGATIVE NEGATIVE   Influenza B by PCR NEGATIVE NEGATIVE    Comment: (NOTE) The Xpert Xpress SARS-CoV-2/FLU/RSV assay is intended as an  aid in  the diagnosis of influenza from Nasopharyngeal swab specimens and  should not be used as a sole basis for treatment. Nasal washings and  aspirates are unacceptable for Xpert Xpress SARS-CoV-2/FLU/RSV  testing.  Fact Sheet for Patients: https://www.moore.com/  Fact Sheet for Healthcare  Providers: https://www.young.biz/  This test is not yet approved or cleared by the [place] FDA and  has been authorized for detection and/or diagnosis of SARS-CoV-2 by  FDA under an Emergency Use Authorization (EUA). This EUA will remain  in effect (meaning this test can be used) for the duration of the  Covid-19 declaration under Section 564(b)(1) of the Act, 21  U.S.C. section 360bbb-3(b)(1), unless the authorization is  terminated or revoked.    Respiratory Syncytial Virus by PCR NEGATIVE NEGATIVE    Comment: (NOTE) Fact Sheet for Patients: https://www.moore.com/  Fact Sheet for Healthcare Providers: https://www.young.biz/  This test is not yet approved or cleared by the [place] FDA and  has been authorized for detection and/or diagnosis of SARS-CoV-2 by  FDA under an Emergency Use Authorization (EUA). This EUA will remain  in effect (meaning this test can be used) for the duration of the  COVID-19 declaration under Section 564(b)(1) of the Act, 21 U.S.C.  section 360bbb-3(b)(1), unless the authorization is terminated or  revoked. Performed at [facility], [address] [place], [place] [address]   Ethanol     Status: None   Collection Time: 09/03/20  7:14 PM  Result Value Ref Range   Alcohol, Ethyl (B) <10 <10 mg/dL    Comment: (NOTE) Lowest detectable limit for serum alcohol is 10 mg/dL.  For medical purposes only. Performed at [facility], [address], [place], [place] [address]   Rapid urine drug screen (hospital performed)     Status: Abnormal   Collection Time: 09/03/20  7:19 PM  Result Value Ref Range   Opiates NONE DETECTED NONE DETECTED   Cocaine POSITIVE (A) NONE DETECTED   Benzodiazepines NONE DETECTED NONE DETECTED   Amphetamines NONE DETECTED NONE DETECTED   Tetrahydrocannabinol POSITIVE (A) NONE DETECTED   Barbiturates NONE DETECTED NONE DETECTED    Comment: (NOTE) DRUG SCREEN  FOR MEDICAL PURPOSES ONLY.  IF CONFIRMATION IS NEEDED FOR ANY PURPOSE, NOTIFY LAB WITHIN 5 DAYS.  LOWEST DETECTABLE LIMITS FOR URINE DRUG SCREEN Drug Class                     Cutoff (ng/mL) Amphetamine and metabolites    1000 Barbiturate and metabolites    200 Benzodiazepine                 200 Tricyclics and metabolites     300 Opiates and metabolites        300 Cocaine and metabolites        300 THC                            50 Performed at [facility], [address], [place], [place] [address]   Troponin I (High Sensitivity)     Status: None   Collection Time: 09/03/20  9:52 PM  Result Value Ref Range   Troponin I (High Sensitivity) 4 <18 ng/L    Comment: (NOTE) Elevated high sensitivity troponin I (hsTnI) values and significant  changes across serial measurements may suggest ACS but many other  chronic and acute conditions are known to elevate hsTnI results.  Refer to the "Links" section  for chest pain algorithms and additional  guidance. Performed at [facility], [address] [place], [place] [address]   Heparin level (unfractionated)     Status: Abnormal   Collection Time: 09/03/20  9:52 PM  Result Value Ref Range   Heparin Unfractionated <0.10 (L) 0.30 - 0.70 IU/mL    Comment: (NOTE) If heparin results are below expected values, and patient dosage has  been confirmed, suggest follow up testing of antithrombin III levels. Performed at [facility], [address] [place], [place] [address]     Chemistries  Recent Labs  Lab 09/03/20 1913  NA 132*  K 3.7  CL 94*  CO2 24  GLUCOSE 118*  BUN 10  CREATININE 1.08  CALCIUM 9.9   ------------------------------------------------------------------------------------------------------------------  ------------------------------------------------------------------------------------------------------------------ GFR: Estimated Creatinine Clearance: 77.3 mL/min (by C-G formula based on SCr of 1.08 mg/dL). Liver  Function Tests: No results for input(s): AST, ALT, ALKPHOS, BILITOT, PROT, ALBUMIN in the last 168 hours. No results for input(s): LIPASE, AMYLASE in the last 168 hours. No results for input(s): AMMONIA in the last 168 hours. Coagulation Profile: No results for input(s): INR, PROTIME in the last 168 hours. Cardiac Enzymes: No results for input(s): CKTOTAL, CKMB, CKMBINDEX, TROPONINI in the last 168 hours. BNP (last 3 results) No results for input(s): PROBNP in the last 8760 hours. HbA1C: No results for input(s): HGBA1C in the last 72 hours. CBG: No results for input(s): GLUCAP in the last 168 hours. Lipid Profile: No results for input(s): CHOL, HDL, LDLCALC, TRIG, CHOLHDL, LDLDIRECT in the last 72 hours. Thyroid Function Tests: No results for input(s): TSH, T4TOTAL, FREET4, T3FREE, THYROIDAB in the last 72 hours. Anemia Panel: No results for input(s): VITAMINB12, FOLATE, FERRITIN, TIBC, IRON, RETICCTPCT in the last 72 hours.  --------------------------------------------------------------------------------------------------------------- Urine analysis:    Component Value Date/Time   COLORURINE YELLOW 04/19/2020 1641   APPEARANCEUR CLEAR 04/19/2020 1641   LABSPEC >1.046 (H) 04/19/2020 1641   PHURINE 5.0 04/19/2020 1641   GLUCOSEU NEGATIVE 04/19/2020 1641   HGBUR SMALL (A) 04/19/2020 1641   BILIRUBINUR NEGATIVE 04/19/2020 1641   KETONESUR 20 (A) 04/19/2020 1641   PROTEINUR NEGATIVE 04/19/2020 1641   NITRITE NEGATIVE 04/19/2020 1641   LEUKOCYTESUR NEGATIVE 04/19/2020 1641      Imaging Results:    CT Angio Chest PE W and/or Wo Contrast  Addendum Date: 09/03/2020   ADDENDUM REPORT: 09/03/2020 20:55 ADDENDUM: Critical Value/emergent results were called by telephone at the time of interpretation on 09/03/2020 at 8:55 pm to Dr. Criss Alvine in the ED, who verbally acknowledged these results. Electronically Signed   By: Odessa Fleming M.D.   On: 09/03/2020 20:55   Result Date:  09/03/2020 CLINICAL DATA:  49 year old male with left lower extremity DVT and PE diagnosed in June, ran out of Eliquis recently. Increased pain and numbness. EXAM: CT ANGIOGRAPHY CHEST WITH CONTRAST TECHNIQUE: Multidetector CT imaging of the chest was performed using the standard protocol during bolus administration of intravenous contrast. Multiplanar CT image reconstructions and MIPs were obtained to evaluate the vascular anatomy. CONTRAST:  OMNIPAQUE IOHEXOL 350 MG/ML SOLN COMPARISON:  CTA chest 04/19/2020.  Portable chest x-ray tonight. FINDINGS: Cardiovascular: Suboptimal contrast bolus timing in the pulmonary arterial tree. Improved bilateral pulmonary artery patency since June although bilateral lobar and segmental pulmonary thrombus persists (left lower lobe series 5, image 119, left upper lobe image 99, right lower lobe image 133. Much of this appears nonocclusive now. There is no saddle embolus. Heart size is stable. No pericardial effusion. Negative  visible aorta. Mediastinum/Nodes: Mediastinal lymph nodes remain within normal limits. Lungs/Pleura: Major airways are patent and lung volumes are similar to June. Small pleural effusions have regressed. However, there is new symmetric bilateral confluent pulmonary ground-glass opacity with an unusual pattern sparing the subpleural lung parenchyma bilaterally (series 6, image 45, 69). Symmetric involvement of the superior lower lobe segments, but the lung bases are relatively spared. Upper Abdomen: Negative visible liver, gallbladder, spleen, pancreas, adrenal glands, kidneys and bowel in the upper abdomen. Musculoskeletal: Stable thoracic endplate degeneration. No acute osseous abnormality identified. Review of the MIP images confirms the above findings. IMPRESSION: 1. Regressed but not resolved bilateral pulmonary emboli since June. Residual bilateral lobar and segmental thrombus which mostly appears nonocclusive today. This could be incompletely  resolved clot versus recurrent Acute PE. 2. New symmetric and confluent pulmonary ground-glass opacity but with an unusual pattern sparing of the subpleural lung and lung bases bilaterally. No pleural effusion. Differential considerations include viral and atypical respiratory infection (including mycoplasma, adenovirus, influenza), but also noninfectious inflammation such as pulmonary alveolar proteinosis, hypersensitivity pneumonitis. Recommend excluding COVID-19. Electronically Signed: By: Odessa Fleming M.D. On: 09/03/2020 20:47   DG Chest Port 1 View  Result Date: 09/03/2020 CLINICAL DATA:  Shortness of breath EXAM: PORTABLE CHEST 1 VIEW COMPARISON:  CT 04/19/2020 FINDINGS: Heart and mediastinal contours are within normal limits. No focal opacities or effusions. No acute bony abnormality. IMPRESSION: No active disease. Electronically Signed   By: Charlett Nose M.D.   On: 09/03/2020 19:49    My personal review of EKG: Rhythm sinus tach, Rate 125/min, QTc 432   Assessment & Plan:    Active Problems:   Pulmonary emboli (HCC)   1. Acute hypoxic respiratory failure 1. O2 saturation documented at 87% 2. Normalized with 2 L nasal cannula 3. CTA reveals pulmonary emboli 4. CTA also reveals pneumonia 5. Continue treatments as below 6. Wean off oxygen as tolerated 2. Pulmonary emboli 1. CTA shows regressed but not resolved bilateral pulmonary emboli since June, residual bilateral lobar and segmental thrombus which mostly appears nonocclusive, new symmetric and confluent pulmonary groundglass opacity but with an unusual pattern sparing of the subpleural lung and lung bases bilaterally 2. Continue heparin drip 3. Patient had run out of Eliquis a week ago 4. Echo to evaluate for right heart strain 5. When last diagnosed with PEs patient had an echo that showed an ejection fraction of 70 to 75% with ventricular septal flattening consistent with right ventricle pressure overload 6. Update echo in the  setting of possibly new PE 7. Ultrasound bilateral lower extremities for clot burden 8. Continue to monitor on telemetry 3. Community-acquired pneumonia 1. White blood cell count of 16.6 2. CTA shows opacities that are consistent with infectious etiology 3. Patient reports shortness of breath, subjective fever at home 4. Start Rocephin and Zithromax 5. Sputum cultures, blood cultures 6. Urine Legionella and strep antigens 7. Continue to monitor 4. Polysubstance abuse 1. Cocaine and marijuana in UDS 2. Patient reports last use of cocaine and marijuana was this morning to try to alleviate left lower extremity pain 3. Patient also drinks 6 pack of beer per day 4. Patient also smokes half a pack of cigarettes per day 5. Advised on the importance of cessation of smoking, drinking, illicit drugs   DVT Prophylaxis-Heparin, no SCDs because of high likelihood of DVT  AM Labs Ordered, also please review Full Orders  Family Communication: No family at bedside Code Status:  Full  Admission status: Inpatient :  The appropriate admission status for this patient is INPATIENT. Inpatient status is judged to be reasonable and necessary in order to provide the required intensity of service to ensure the patient's safety. The patient's presenting symptoms, physical exam findings, and initial radiographic and laboratory data in the context of their chronic comorbidities is felt to place them at high risk for further clinical deterioration. Furthermore, it is not anticipated that the patient will be medically stable for discharge from the hospital within 2 midnights of admission. The following factors support the admission status of inpatient.     The patient's presenting symptoms include dyspnea The worrisome physical exam findings include left lower extremity edema The initial radiographic and laboratory data are worrisome because of CT shows pulm emboli        * I certify that at the point of  admission it is my clinical judgment that the patient will require inpatient hospital care spanning beyond 2 midnights from the point of admission due to high intensity of service, high risk for further deterioration and high frequency of surveillance required.*  Time spent in minutes : 64   Gerianne Simonet B Zierle-Ghosh DO

## 2020-09-03 NOTE — ED Triage Notes (Signed)
Patient brought in via EMS. Alert and oriented. Airway patent. Patient c/o left leg pain. Per patient has dvt in left leg in which he was taking Eliquis. Patient states he ran out of medication on Tuesday. Patient now c/o increased pain/numbness in left leg. No swelling noted. Pedal pulse strong. Patient lhas labored breathing, when asked patient stated shortness of breath since yesterday. O2 sat 89% on room air in triage.

## 2020-09-03 NOTE — ED Notes (Signed)
Pt placed on 2L of O2 per [place], O2 94% or greater since

## 2020-09-03 NOTE — Progress Notes (Addendum)
ANTICOAGULATION CONSULT NOTE - Initial Consult  Pharmacy Consult for heparin Indication: pulmonary embolus  Allergies  Allergen Reactions  . Bee Venom Anaphylaxis  . Banana   . 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    Patient Measurements: Height: 5\' 5"  (165.1 cm) Weight: 74.8 kg (165 lb) IBW/kg (Calculated) : 61.5 Heparin Dosing Weight: 75 kg  Vital Signs: Temp: 99.8 F (37.7 C) (10/17 1851) Temp Source: Oral (10/17 1851) BP: 120/78 (10/17 2100) Pulse Rate: 123 (10/17 2100)  Labs: Recent Labs    09/03/20 1913  HGB 15.1  HCT 44.6  PLT 207  CREATININE 1.08  TROPONINIHS 3    Estimated Creatinine Clearance: 77.3 mL/min (by C-G formula based on SCr of 1.08 mg/dL).   Medical History: Past Medical History:  Diagnosis Date  . DVT (deep venous thrombosis) (HCC)     Medications:  (Not in a hospital admission)   Assessment: Pharmacy consulted to dose heparin in patient with PE.  history of DVT and pulmonary embolism in 04/2020. Patient is on apixaban prior to admission with last dose given 08/29/20.   Goal of Therapy:  Heparin level 0.3-0.7 units/ml aPTT 66-102 seconds Monitor platelets by anticoagulation protocol: Yes   Plan:  Give 5000 units bolus x 1 Start heparin infusion at 1300 units/hr Check anti-Xa level in 6 hours and daily while on heparin Continue to monitor H&H and platelets  10/29/20 Lular Letson 09/03/2020,9:49 PM

## 2020-09-03 NOTE — Progress Notes (Deleted)
ANTICOAGULATION CONSULT NOTE - Follow Up Consult  Pharmacy Consult for heparin Indication: pulmonary embolus  Labs: Recent Labs    09/03/20 1913 09/03/20 2152  HGB 15.1  --   HCT 44.6  --   PLT 207  --   HEPARINUNFRC  --  <0.10*  CREATININE 1.08  --   TROPONINIHS 3 4    Assessment: 49yo male subtherapeutic on heparin with initial dosing for PE after pt ran out of Eliquis.  Goal of Therapy:  Heparin level 0.3-0.7 units/ml   Plan:  Will give heparin bolus of 3000 units and increase heparin gtt by 4 units/kg/hr to 1600 units/hr and check level in 6 hours.    Vernard Gambles, PharmD, BCPS  09/03/2020,11:27 PM

## 2020-09-04 ENCOUNTER — Inpatient Hospital Stay (HOSPITAL_COMMUNITY): Payer: Self-pay

## 2020-09-04 ENCOUNTER — Other Ambulatory Visit: Payer: Self-pay

## 2020-09-04 ENCOUNTER — Encounter (HOSPITAL_COMMUNITY): Payer: Self-pay | Admitting: Family Medicine

## 2020-09-04 DIAGNOSIS — I824Y3 Acute embolism and thrombosis of unspecified deep veins of proximal lower extremity, bilateral: Secondary | ICD-10-CM

## 2020-09-04 DIAGNOSIS — I2609 Other pulmonary embolism with acute cor pulmonale: Secondary | ICD-10-CM

## 2020-09-04 DIAGNOSIS — J189 Pneumonia, unspecified organism: Secondary | ICD-10-CM | POA: Diagnosis present

## 2020-09-04 DIAGNOSIS — I82403 Acute embolism and thrombosis of unspecified deep veins of lower extremity, bilateral: Secondary | ICD-10-CM | POA: Diagnosis present

## 2020-09-04 DIAGNOSIS — F1910 Other psychoactive substance abuse, uncomplicated: Secondary | ICD-10-CM

## 2020-09-04 DIAGNOSIS — D72829 Elevated white blood cell count, unspecified: Secondary | ICD-10-CM | POA: Diagnosis present

## 2020-09-04 DIAGNOSIS — J9601 Acute respiratory failure with hypoxia: Secondary | ICD-10-CM | POA: Diagnosis present

## 2020-09-04 DIAGNOSIS — F1010 Alcohol abuse, uncomplicated: Secondary | ICD-10-CM

## 2020-09-04 LAB — HEPARIN LEVEL (UNFRACTIONATED)
Heparin Unfractionated: 0.71 IU/mL — ABNORMAL HIGH (ref 0.30–0.70)
Heparin Unfractionated: 0.46 IU/mL (ref 0.30–0.70)

## 2020-09-04 LAB — COMPREHENSIVE METABOLIC PANEL
ALT: 14 U/L (ref 0–44)
AST: 20 U/L (ref 15–41)
Albumin: 2.9 g/dL — ABNORMAL LOW (ref 3.5–5.0)
Alkaline Phosphatase: 37 U/L — ABNORMAL LOW (ref 38–126)
Anion gap: 11 (ref 5–15)
BUN: 10 mg/dL (ref 6–20)
CO2: 25 mmol/L (ref 22–32)
Calcium: 9 mg/dL (ref 8.9–10.3)
Chloride: 99 mmol/L (ref 98–111)
Creatinine, Ser: 1.01 mg/dL (ref 0.61–1.24)
GFR, Estimated: >60 mL/min (ref >60)
Glucose, Bld: 115 mg/dL — ABNORMAL HIGH (ref 70–99)
Potassium: 3.7 mmol/L (ref 3.5–5.1)
Sodium: 135 mmol/L (ref 135–145)
Total Bilirubin: 0.8 mg/dL (ref 0.3–1.2)
Total Protein: 7.3 g/dL (ref 6.5–8.1)

## 2020-09-04 LAB — ECHOCARDIOGRAM COMPLETE
AR max vel: 2.44 cm2
AV Area VTI: 2.68 cm2
AV Area mean vel: 2.31 cm2
AV Mean grad: 3.5 mmHg
AV Peak grad: 7 mmHg
Ao pk vel: 1.32 m/s
Area-P 1/2: 3.61 cm2
Height: 65 in
S' Lateral: 2.56 cm
Weight: 2640 oz

## 2020-09-04 LAB — CBC
HCT: 40.5 % (ref 39.0–52.0)
Hemoglobin: 13.6 g/dL (ref 13.0–17.0)
MCH: 30.2 pg (ref 26.0–34.0)
MCHC: 33.6 g/dL (ref 30.0–36.0)
MCV: 90 fL (ref 80.0–100.0)
Platelets: 210 10*3/uL (ref 150–400)
RBC: 4.5 MIL/uL (ref 4.22–5.81)
RDW: 15.7 % — ABNORMAL HIGH (ref 11.5–15.5)
WBC: 20.2 10*3/uL — ABNORMAL HIGH (ref 4.0–10.5)
nRBC: 0 % (ref 0.0–0.2)

## 2020-09-04 LAB — STREP PNEUMONIAE URINARY ANTIGEN: Strep Pneumo Urinary Antigen: NEGATIVE

## 2020-09-04 LAB — PHOSPHORUS: Phosphorus: 3.6 mg/dL (ref 2.5–4.6)

## 2020-09-04 LAB — HIV ANTIBODY (ROUTINE TESTING W REFLEX): HIV Screen 4th Generation wRfx: NONREACTIVE

## 2020-09-04 LAB — MAGNESIUM: Magnesium: 1.8 mg/dL (ref 1.7–2.4)

## 2020-09-04 MED ORDER — SODIUM CHLORIDE 0.9 % IV SOLN
500.0000 mg | INTRAVENOUS | Status: DC
  Administered 2020-09-04 – 2020-09-05 (×2): 500 mg via INTRAVENOUS
  Filled 2020-09-04 (×2): qty 500

## 2020-09-04 MED ORDER — MORPHINE SULFATE (PF) 2 MG/ML IV SOLN
1.0000 mg | INTRAVENOUS | Status: DC | PRN
  Administered 2020-09-04 – 2020-09-05 (×5): 1 mg via INTRAVENOUS
  Filled 2020-09-04 (×5): qty 1

## 2020-09-04 NOTE — Progress Notes (Signed)
ANTICOAGULATION CONSULT NOTE -   Pharmacy Consult for heparin Indication: pulmonary embolus  Allergies  Allergen Reactions  . Bee Venom Anaphylaxis  . Banana   . 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    Patient Measurements: Height: 5\' 5"  (165.1 cm) Weight: 74.8 kg (165 lb) IBW/kg (Calculated) : 61.5 Heparin Dosing Weight: 75 kg  Vital Signs: Temp: 98.2 F (36.8 C) (10/18 0839) BP: 118/79 (10/18 0839) Pulse Rate: 86 (10/18 0839)  Labs: Recent Labs    09/03/20 1913 09/03/20 2152 09/04/20 0447 09/04/20 1113  HGB 15.1  --  13.6  --   HCT 44.6  --  40.5  --   PLT 207  --  210  --   HEPARINUNFRC  --  <0.10* 0.71* 0.46  CREATININE 1.08  --  1.01  --   TROPONINIHS 3 4  --   --     Estimated Creatinine Clearance: 82.7 mL/min (by C-G formula based on SCr of 1.01 mg/dL).   Medical History: Past Medical History:  Diagnosis Date  . DVT (deep venous thrombosis) (HCC)     Medications:  Medications Prior to Admission  Medication Sig Dispense Refill Last Dose  . apixaban (ELIQUIS) 5 MG TABS tablet Take 1 tablet (5 mg total) by mouth 2 (two) times daily. 60 tablet 3 08/29/2020 at Unknown time  . apixaban (ELIQUIS) 5 MG TABS tablet Take 2 tablets (10 mg total) by mouth 2 (two) times daily for 7 days. 60 tablet 0   . oxyCODONE 10 MG TABS Take 1 tablet (10 mg total) by mouth every 6 (six) hours as needed for moderate pain, severe pain or breakthrough pain. 15 tablet 0     Assessment: Pharmacy consulted to dose heparin in patient with PE.  history of DVT and pulmonary embolism in 04/2020. Patient is on apixaban prior to admission with last dose given 08/29/20.  HL 0.46- therapeutic   Goal of Therapy:  HL 0.3-0.7 Monitor platelets by anticoagulation protocol: Yes   Plan:  Continue heparin infusion at 1300 units/hr Check anti-Xa level daily while on heparin Continue to monitor H&H and platelets.  10/29/20, PharmD Clinical Pharmacist 09/04/2020 1:06 PM

## 2020-09-04 NOTE — Progress Notes (Addendum)
[facility]  PROGRESS NOTE  Terrence Christian VWU:[id number] DOB: 05-02-1971 DOA: 09/03/2020 PCP: Patient, [name] Admit date - 09/03/2020   Admitting Physician Lilyan Gilford, DO  Outpatient Primary MD for the patient is Patient, [name]  LOS - 1 Brief Narrative  Mr. Bara is a 49 year old male with medical history significant for asthma, tobacco abuse, spider bite followed by bilateral acute PE with bilateral DVT (04/2020) presumed to be provoked after 10-hour drive on Eliquis who presented to the ED on 10/17 with worsening shortness of breath and left leg pain x2 days after being nonadherent with his Eliquis over the past 2 weeks as well as admitted increased alcohol intake (admits to sixpack of beer daily as well as drinking "1/5 of liquor every day to deal with the pain").  Patient was admitted with working diagnosis of acute hypoxic respiratory failure presumed secondary to potential current PE in the setting of nonadherence to Eliquis as well as possible CAP complicated by high concern for alcohol withdrawal.    Subjective  Today he denies any chest pain.  Feels his breathing is improved.  States he is hungry.  Wants to eat.  A & P     Acute hypoxic respiratory failure likely multifactorial etiology including possible recurrent PE as well as high concern for atypical respiratory infection based off CTA imaging.  Covid test negative.  Briefly required 2 L in the ED now on room air.  CTA shows possible incompletely resolved clot versus recurrent acute PE -Currently on IV heparin -Azithromycin, ceftriaxone, strep pneumo/Legionella pending -Sputum culture if cough becomes productive -Currently not requiring any O2  Bilateral PE with bilateral DVTs with extensive occlusive thrombus in left leg (unchanged from prior) in setting ofpoor adherence to Eliquis.  Initially diagnosed 04/2020.  Presented with shortness of breath in the setting of poor adherence to Eliquis over the  past 2 weeks.  CTA is not definitive but notes no interval worsening of clot burden and possible interval resolution versus recurrence.  Venous duplex shows persistent bilateral DVTs with no changes in appearance. No signs of compartment syndrome, painful but palpable pulses and neurologically intact -Obtain TTE to rule out any changes in EF or right ventricular systolic overload -Currently no O2 requirements --Serial exams to monitor while on IV heparin, no current indication for thrombolysis  Admitted alcohol abuse.  Currently not displaying any symptoms of withdrawal.  Admits last drink was day of hospitalization, ethanol level less than 10 -Closely monitor on CIWA protocol currently receiving IV Ativan every 6 hours for first 48, -Ativan as needed according to CIWA protocol  Leukocytosis, worsening.  69 admission currently 20.  Remains afebrile.  Potential atypical respiratory infection is noted on CTA.  Now on room air with no respiratory distress, otherwise no localizing signs or symptoms of infection -Continue azithromycin, ceftriaxone -Monitor blood cultures  Polysubstance abuse.  UDS positive for cocaine and THC on admission -Counseling provided on admission   Family Communication  : None  Code Status : Full  Disposition Plan  :  Patient is from home. Anticipated d/c date: 2 to 3 days. Barriers to d/c or necessity for inpatient status:  Need to continue IV hyperpneic ensure therapeutic levels, close monitoring of breathing status, close monitoring of mental status given high concern for potential for alcohol withdrawal, continue IV antibiotics for presumed pneumonia Consults  : None  Procedures  : TTE, venous duplex  DVT Prophylaxis  : IV heparin  MDM: The below labs  and imaging reports were reviewed and summarized above.  Medication management as above.  Lab Results  Component Value Date   PLT 210 09/04/2020    Diet :  Diet Order            Diet Heart Room service  appropriate? Yes; Fluid consistency: Thin  Diet effective now                  Inpatient Medications Scheduled Meds: . folic acid  1 mg Oral Daily  . LORazepam  0-4 mg Intravenous Q6H   Followed by  . [START ON 09/05/2020] LORazepam  0-4 mg Intravenous Q12H  . multivitamin with minerals  1 tablet Oral Daily  . thiamine  100 mg Oral Daily   Or  . thiamine  100 mg Intravenous Daily   Continuous Infusions: . azithromycin    . cefTRIAXone (ROCEPHIN)  IV    . heparin 1,300 Units/hr (09/04/20 1118)   PRN Meds:.LORazepam **OR** LORazepam, oxyCODONE  Antibiotics  :   Anti-infectives (From admission, onward)   Start     Dose/Rate Route Frequency Ordered Stop   09/04/20 2230  cefTRIAXone (ROCEPHIN) 2 g in sodium chloride 0.9 % 100 mL IVPB        2 g 200 mL/hr over 30 Minutes Intravenous Every 24 hours 09/03/20 2313 09/09/20 2229   09/04/20 2230  azithromycin (ZITHROMAX) 500 mg in sodium chloride 0.9 % 250 mL IVPB  Status:  Discontinued        500 mg 250 mL/hr over 60 Minutes Intravenous Every 24 hours 09/03/20 2313 09/04/20 0738   09/04/20 2230  azithromycin (ZITHROMAX) 500 mg in sodium chloride 0.9 % 250 mL IVPB        500 mg 250 mL/hr over 60 Minutes Intravenous Every 24 hours 09/04/20 0738 09/08/20 2229   09/03/20 2145  cefTRIAXone (ROCEPHIN) 1 g in sodium chloride 0.9 % 100 mL IVPB        1 g 200 mL/hr over 30 Minutes Intravenous  Once 09/03/20 2144 09/03/20 2314   09/03/20 2145  azithromycin (ZITHROMAX) 500 mg in sodium chloride 0.9 % 250 mL IVPB        500 mg 250 mL/hr over 60 Minutes Intravenous  Once 09/03/20 2144 09/04/20 0144       Objective   Vitals:   09/04/20 0745 09/04/20 0800 09/04/20 0800 09/04/20 0839  BP:  114/69 101/68 118/79  Pulse: 90 90 90 86  Resp:    18  Temp:    98.2 F (36.8 C)  TempSrc:      SpO2: 98% 98%  100%  Weight:      Height:        SpO2: 100 %  Wt Readings from Last 3 Encounters:  09/03/20 74.8 kg  04/24/20 71 kg      Intake/Output Summary (Last 24 hours) at 09/04/2020 1225 Last data filed at 09/04/2020 0144 Gross per 24 hour  Intake 2485 ml  Output --  Net 2485 ml    Physical Exam:     Awake Alert, Oriented X 3, Normal affect No new F.N deficits,  [place].AT, Normal respiratory effort on air, CTAB RRR,No Gallops,Rubs or new Murmurs, no peripheral edema +ve B.Sounds, Abd Soft, No tenderness, No rebound, guarding or rigidity. No Cyanosis, No new Rash or bruise   Peripheral pulses intact bilaterally in lower extremities   I have personally reviewed the following:   Data Reviewed:  CBC Recent Labs  Lab 09/03/20 1913 09/04/20 0447  WBC 16.6* 20.2*  HGB 15.1 13.6  HCT 44.6 40.5  PLT 207 210  MCV 88.7 90.0  MCH 30.0 30.2  MCHC 33.9 33.6  RDW 15.7* 15.7*  LYMPHSABS 1.2  --   MONOABS 0.7  --   EOSABS 0.0  --   BASOSABS 0.1  --     Chemistries  Recent Labs  Lab 09/03/20 1913 09/04/20 0447  NA 132* 135  K 3.7 3.7  CL 94* 99  CO2 24 25  GLUCOSE 118* 115*  BUN 10 10  CREATININE 1.08 1.01  CALCIUM 9.9 9.0  MG  --  1.8  AST  --  20  ALT  --  14  ALKPHOS  --  37*  BILITOT  --  0.8   ------------------------------------------------------------------------------------------------------------------ No results for input(s): CHOL, HDL, LDLCALC, TRIG, CHOLHDL, LDLDIRECT in the last 72 hours.  No results found for: HGBA1C ------------------------------------------------------------------------------------------------------------------ No results for input(s): TSH, T4TOTAL, T3FREE, THYROIDAB in the last 72 hours.  Invalid input(s): FREET3 ------------------------------------------------------------------------------------------------------------------ No results for input(s): VITAMINB12, FOLATE, FERRITIN, TIBC, IRON, RETICCTPCT in the last 72 hours.  Coagulation profile No results for input(s): INR, PROTIME in the last 168 hours.  No results for input(s): DDIMER in the  last 72 hours.  Cardiac Enzymes No results for input(s): CKMB, TROPONINI, MYOGLOBIN in the last 168 hours.  Invalid input(s): CK ------------------------------------------------------------------------------------------------------------------    Component Value Date/Time   BNP 14.0 04/19/2020 1050    Micro Results Recent Results (from the past 240 hour(s))  Resp Panel by RT PCR (RSV, Flu A&B, Covid) - Nasopharyngeal Swab     Status: None   Collection Time: 09/03/20  7:13 PM   Specimen: Nasopharyngeal Swab  Result Value Ref Range Status   SARS Coronavirus 2 by RT PCR NEGATIVE NEGATIVE Final    Comment: (NOTE) SARS-CoV-2 target nucleic acids are NOT DETECTED.  The SARS-CoV-2 RNA is generally detectable in upper respiratoy specimens during the acute phase of infection. The lowest concentration of SARS-CoV-2 viral copies this assay can detect is 131 copies/mL. A negative result does not preclude SARS-Cov-2 infection and should not be used as the sole basis for treatment or other patient management decisions. A negative result may occur with  improper specimen collection/handling, submission of specimen other than nasopharyngeal swab, presence of viral mutation(s) within the areas targeted by this assay, and inadequate number of viral copies (<131 copies/mL). A negative result must be combined with clinical observations, patient history, and epidemiological information. The expected result is Negative.  Fact Sheet for Patients:  https://www.moore.com/  Fact Sheet for Healthcare Providers:  https://www.young.biz/  This test is no t yet approved or cleared by the [place] FDA and  has been authorized for detection and/or diagnosis of SARS-CoV-2 by FDA under an Emergency Use Authorization (EUA). This EUA will remain  in effect (meaning this test can be used) for the duration of the COVID-19 declaration under Section 564(b)(1) of the  Act, 21 U.S.C. section 360bbb-3(b)(1), unless the authorization is terminated or revoked sooner.     Influenza A by PCR NEGATIVE NEGATIVE Final   Influenza B by PCR NEGATIVE NEGATIVE Final    Comment: (NOTE) The Xpert Xpress SARS-CoV-2/FLU/RSV assay is intended as an aid in  the diagnosis of influenza from Nasopharyngeal swab specimens and  should not be used as a sole basis for treatment. Nasal washings and  aspirates are unacceptable for Xpert Xpress SARS-CoV-2/FLU/RSV  testing.  Fact Sheet for Patients: https://www.moore.com/  Fact Sheet for Healthcare Providers: https://www.young.biz/  This test is not yet approved or cleared by the [place] and  has been authorized for detection and/or diagnosis of SARS-CoV-2 by  FDA under an Emergency Use Authorization (EUA). This EUA will remain  in effect (meaning this test can be used) for the duration of the  Covid-19 declaration under Section 564(b)(1) of the Act, 21  U.S.C. section 360bbb-3(b)(1), unless the authorization is  terminated or revoked.    Respiratory Syncytial Virus by PCR NEGATIVE NEGATIVE Final    Comment: (NOTE) Fact Sheet for Patients: https://www.moore.com/  Fact Sheet for Healthcare Providers: https://www.young.biz/  This test is not yet approved or cleared by the [place] FDA and  has been authorized for detection and/or diagnosis of SARS-CoV-2 by  FDA under an Emergency Use Authorization (EUA). This EUA will remain  in effect (meaning this test can be used) for the duration of the  COVID-19 declaration under Section 564(b)(1) of the Act, 21 U.S.C.  section 360bbb-3(b)(1), unless the authorization is terminated or  revoked. Performed at [facility], [address], [place], [place] [address]   Culture, blood (routine x 2)     Status: None (Preliminary result)   Collection Time: 09/03/20  9:52 PM   Specimen: BLOOD  LEFT ARM  Result Value Ref Range Status   Specimen Description BLOOD LEFT ARM  Final   Special Requests   Final    BOTTLES DRAWN AEROBIC AND ANAEROBIC Blood Culture adequate volume   Culture   Final    NO GROWTH < 12 HOURS Performed at [facility], [address] [place], [place] [address]    Report Status PENDING  Incomplete  Culture, blood (routine x 2)     Status: None (Preliminary result)   Collection Time: 09/03/20 10:00 PM   Specimen: Left Antecubital; Blood  Result Value Ref Range Status   Specimen Description LEFT ANTECUBITAL  Final   Special Requests   Final    BOTTLES DRAWN AEROBIC AND ANAEROBIC Blood Culture adequate volume   Culture   Final    NO GROWTH < 12 HOURS Performed at [facility], [address] [place], [place] [address]    Report Status PENDING  Incomplete    Radiology Reports CT Angio Chest PE W and/or Wo Contrast  Addendum Date: 09/03/2020   ADDENDUM REPORT: 09/03/2020 20:55 ADDENDUM: Critical Value/emergent results were called by telephone at the time of interpretation on 09/03/2020 at 8:55 pm to Dr. Criss Alvine in the ED, who verbally acknowledged these results. Electronically Signed   By: Odessa Fleming M.D.   On: 09/03/2020 20:55   Result Date: 09/03/2020 CLINICAL DATA:  49 year old male with left lower extremity DVT and PE diagnosed in June, ran out of Eliquis recently. Increased pain and numbness. EXAM: CT ANGIOGRAPHY CHEST WITH CONTRAST TECHNIQUE: Multidetector CT imaging of the chest was performed using the standard protocol during bolus administration of intravenous contrast. Multiplanar CT image reconstructions and MIPs were obtained to evaluate the vascular anatomy. CONTRAST:  OMNIPAQUE IOHEXOL 350 MG/ML SOLN COMPARISON:  CTA chest 04/19/2020.  Portable chest x-ray tonight. FINDINGS: Cardiovascular: Suboptimal contrast bolus timing in the pulmonary arterial tree. Improved bilateral pulmonary artery patency since June although bilateral lobar and  segmental pulmonary thrombus persists (left lower lobe series 5, image 119, left upper lobe image 99, right lower lobe image 133. Much of this appears nonocclusive now. There is no saddle embolus. Heart size is stable. No pericardial effusion. Negative visible aorta. Mediastinum/Nodes: Mediastinal lymph nodes remain within normal limits.  Lungs/Pleura: Major airways are patent and lung volumes are similar to June. Small pleural effusions have regressed. However, there is new symmetric bilateral confluent pulmonary ground-glass opacity with an unusual pattern sparing the subpleural lung parenchyma bilaterally (series 6, image 45, 69). Symmetric involvement of the superior lower lobe segments, but the lung bases are relatively spared. Upper Abdomen: Negative visible liver, gallbladder, spleen, pancreas, adrenal glands, kidneys and bowel in the upper abdomen. Musculoskeletal: Stable thoracic endplate degeneration. No acute osseous abnormality identified. Review of the MIP images confirms the above findings. IMPRESSION: 1. Regressed but not resolved bilateral pulmonary emboli since June. Residual bilateral lobar and segmental thrombus which mostly appears nonocclusive today. This could be incompletely resolved clot versus recurrent Acute PE. 2. New symmetric and confluent pulmonary ground-glass opacity but with an unusual pattern sparing of the subpleural lung and lung bases bilaterally. No pleural effusion. Differential considerations include viral and atypical respiratory infection (including mycoplasma, adenovirus, influenza), but also noninfectious inflammation such as pulmonary alveolar proteinosis, hypersensitivity pneumonitis. Recommend excluding COVID-19. Electronically Signed: By: Odessa Fleming M.D. On: 09/03/2020 20:47   US Venous Img Lower Bilateral (DVT)  Result Date: 09/04/2020 CLINICAL DATA:  Pulmonary emboli. EXAM: BILATERAL LOWER EXTREMITY VENOUS DOPPLER ULTRASOUND TECHNIQUE: Gray-scale sonography with  compression, as well as color and duplex ultrasound, were performed to evaluate the deep venous system(s) from the level of the common femoral vein through the popliteal and proximal calf veins. COMPARISON:  Bilateral lower extremity ultrasound 04/19/2020. FINDINGS: VENOUS Right lower extremity: There is partially occlusive thrombus seen in the popliteal vein. Thrombus in the femoral vein and calf veins is no longer visualized. No new thrombus. Left lower extremity: Occlusive thrombus is seen in the common femoral, profundus femoral, femoral and popliteal veins as well as in the peroneal vein. The appearance is not markedly changed. OTHER None. Limitations: none IMPRESSION: Persistent bilateral deep venous thrombosis is much worse on the left. The appearance of the right leg is improved. There has been little change in the extensive thrombus in the left leg. Electronically Signed   By: Drusilla Kanner M.D.   On: 09/04/2020 12:06   DG Chest Port 1 View  Result Date: 09/03/2020 CLINICAL DATA:  Shortness of breath EXAM: PORTABLE CHEST 1 VIEW COMPARISON:  CT 04/19/2020 FINDINGS: Heart and mediastinal contours are within normal limits. No focal opacities or effusions. No acute bony abnormality. IMPRESSION: No active disease. Electronically Signed   By: Charlett Nose M.D.   On: 09/03/2020 19:49     Time Spent in minutes  30     Laverna Peace M.D on 09/04/2020 at 12:25 PM  To page go to [email or web address] - password [facility]

## 2020-09-04 NOTE — Progress Notes (Signed)
*  PRELIMINARY RESULTS* Echocardiogram 2D Echocardiogram has been performed.  Jeryl Columbia 09/04/2020, 12:03 PM

## 2020-09-04 NOTE — Progress Notes (Signed)
ANTICOAGULATION CONSULT NOTE  Pharmacy Consult for heparin Indication: pulmonary embolus   Assessment: Pharmacy consulted to dose heparin in patient with PE.  history of DVT and pulmonary embolism in 04/2020. Patient is on apixaban prior to admission with last dose given 08/29/20. Heparin level this am 0.71 units/ml  Hg 13.6, PTLC 210   Goal of Therapy:  Heparin level 0.3-0.7 units/ml aPTT 66-102 seconds Monitor platelets by anticoagulation protocol: Yes   Plan:  Continue heparin at 1300 units/ml Check heparin level later today  Terrence Christian 09/04/2020,5:24 AM

## 2020-09-04 NOTE — TOC Initial Note (Signed)
Transition of Care [facility]) - Initial/Assessment Note    Patient Details  Name: Terrence Christian MRN: [id number] Date of Birth: Oct 27, 1971  [facility] CM/SW Contact:    Karn Cassis, LCSW Phone Number: 09/04/2020, 10:03 AM  Clinical Narrative:  Pt admitted due to pulmonary emboli. TOC consulted for substance abuse. Pt reports he lives with his brother. He is not working and has no insurance. Pt agreeable to referral to [facility] and financial counselor. Both referrals made. Pt admits to drinking a 6 pack daily in addition to 2 shots. He smokes 10 blunts a day and uses cocaine every few days. However, pt said when he is in pain, he uses more. Pt reports he was bit by a spider about 3 months ago and has had significant pain in leg since then. LCSW discussed substance abuse resources and pt has no interest. He does not feel this is a problem for him. Instead feels "it helps." Pt refuses outpatient resource list. TOC will sign off.                 Expected Discharge Plan: Home/Self Care Barriers to Discharge: Continued Medical Work up   Patient Goals and CMS Choice Patient states their goals for this hospitalization and ongoing recovery are:: return home      Expected Discharge Plan and Services Expected Discharge Plan: Home/Self Care In-house Referral: Clinical Social Work     Living arrangements for the past 2 months: Single Family Home                                      Prior Living Arrangements/Services Living arrangements for the past 2 months: Single Family Home Lives with:: Siblings Patient language and need for interpreter reviewed:: Yes Do you feel safe going back to the place where you live?: Yes      Need for Family Participation in Patient Care: No (Comment)     Criminal Activity/Legal Involvement Pertinent to Current Situation/Hospitalization: No - Comment as needed  Activities of Daily Living Home Assistive Devices/Equipment:  None ADL Screening (condition at time of admission) Patient's cognitive ability adequate to safely complete daily activities?: Yes Is the patient deaf or have difficulty hearing?: No Does the patient have difficulty seeing, even when wearing glasses/contacts?: No Does the patient have difficulty concentrating, remembering, or making decisions?: No Patient able to express need for assistance with ADLs?: Yes Does the patient have difficulty dressing or bathing?: No Independently performs ADLs?: Yes (appropriate for developmental age) Does the patient have difficulty walking or climbing stairs?: No Weakness of Legs: None Weakness of Arms/Hands: None  Permission Sought/Granted         Permission granted to share info w AGENCY: [facility]        Emotional Assessment Appearance:: Appears stated age Attitude/Demeanor/Rapport: Other (comment) (in pain)   Orientation: : Oriented to Self, Oriented to Place, Oriented to  Time, Oriented to Situation Alcohol / Substance Use: Alcohol Use, Illicit Drugs Psych Involvement: No (comment)  Admission diagnosis:  Pulmonary emboli (HCC) [I26.99] Pulmonary embolism without acute cor pulmonale, unspecified chronicity, unspecified pulmonary embolism type (HCC) [I26.99] Patient Active Problem List   Diagnosis Date Noted  . Pulmonary emboli (HCC) 09/03/2020  . 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 Asthma 04/19/2020   PCP:  Patient, [name] Pharmacy:   [facility] [address], [place] - [address]  Maurice March [address] [place] [address] Phone: [phone] Fax: [phone]  [facility] [address], [place] - [address] [place] [address] [place] [address] Phone: [phone] Fax: [phone]     Social Determinants of Health (SDOH) Interventions    Readmission Risk Interventions No flowsheet data found.

## 2020-09-05 DIAGNOSIS — J189 Pneumonia, unspecified organism: Secondary | ICD-10-CM

## 2020-09-05 DIAGNOSIS — I82513 Chronic embolism and thrombosis of femoral vein, bilateral: Secondary | ICD-10-CM

## 2020-09-05 DIAGNOSIS — I2609 Other pulmonary embolism with acute cor pulmonale: Secondary | ICD-10-CM

## 2020-09-05 DIAGNOSIS — I2782 Chronic pulmonary embolism: Secondary | ICD-10-CM

## 2020-09-05 LAB — CBC
HCT: 39.1 % (ref 39.0–52.0)
Hemoglobin: 12.5 g/dL — ABNORMAL LOW (ref 13.0–17.0)
MCH: 29.2 pg (ref 26.0–34.0)
MCHC: 32 g/dL (ref 30.0–36.0)
MCV: 91.4 fL (ref 80.0–100.0)
Platelets: 244 10*3/uL (ref 150–400)
RBC: 4.28 MIL/uL (ref 4.22–5.81)
RDW: 15.5 % (ref 11.5–15.5)
WBC: 11.7 10*3/uL — ABNORMAL HIGH (ref 4.0–10.5)
nRBC: 0 % (ref 0.0–0.2)

## 2020-09-05 LAB — BASIC METABOLIC PANEL
Anion gap: 8 (ref 5–15)
BUN: 12 mg/dL (ref 6–20)
CO2: 26 mmol/L (ref 22–32)
Calcium: 8.8 mg/dL — ABNORMAL LOW (ref 8.9–10.3)
Chloride: 102 mmol/L (ref 98–111)
Creatinine, Ser: 1 mg/dL (ref 0.61–1.24)
GFR, Estimated: >60 mL/min (ref >60)
Glucose, Bld: 113 mg/dL — ABNORMAL HIGH (ref 70–99)
Potassium: 3.5 mmol/L (ref 3.5–5.1)
Sodium: 136 mmol/L (ref 135–145)

## 2020-09-05 LAB — HEPARIN LEVEL (UNFRACTIONATED)
Heparin Unfractionated: 0.21 IU/mL — ABNORMAL LOW (ref 0.30–0.70)
Heparin Unfractionated: 0.29 IU/mL — ABNORMAL LOW (ref 0.30–0.70)
Heparin Unfractionated: 0.55 IU/mL (ref 0.30–0.70)

## 2020-09-05 LAB — LEGIONELLA PNEUMOPHILA SEROGP 1 UR AG: L. pneumophila Serogp 1 Ur Ag: NEGATIVE

## 2020-09-05 MED ORDER — HEPARIN BOLUS VIA INFUSION
1500.0000 [IU] | Freq: Once | INTRAVENOUS | Status: AC
  Administered 2020-09-05: 1500 [IU] via INTRAVENOUS
  Filled 2020-09-05: qty 1500

## 2020-09-05 NOTE — Progress Notes (Signed)
ANTICOAGULATION CONSULT NOTE -   Pharmacy Consult for heparin Indication: pulmonary embolus  Allergies  Allergen Reactions  . Bee Venom Anaphylaxis  . Banana   . 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    Patient Measurements: Height: 5\' 5"  (165.1 cm) Weight: 74.8 kg (165 lb) IBW/kg (Calculated) : 61.5 Heparin Dosing Weight: 75 kg  Vital Signs: Temp: 99 F (37.2 C) (10/19 0533) BP: 120/71 (10/19 0533) Pulse Rate: 91 (10/19 0533)  Labs: Recent Labs    09/03/20 1913 09/03/20 2152 09/03/20 2152 09/04/20 0447 09/04/20 1113 09/05/20 0644  HGB 15.1  --    < > 13.6  --  12.5*  HCT 44.6  --   --  40.5  --  39.1  PLT 207  --   --  210  --  244  HEPARINUNFRC  --  <0.10*   < > 0.71* 0.46 0.21*  CREATININE 1.08  --   --  1.01  --  1.00  TROPONINIHS 3 4  --   --   --   --    < > = values in this interval not displayed.    Estimated Creatinine Clearance: 83.5 mL/min (by C-G formula based on SCr of 1 mg/dL).   Medical History: Past Medical History:  Diagnosis Date  . DVT (deep venous thrombosis) (HCC)     Medications:  Medications Prior to Admission  Medication Sig Dispense Refill Last Dose  . apixaban (ELIQUIS) 5 MG TABS tablet Take 1 tablet (5 mg total) by mouth 2 (two) times daily. 60 tablet 3 08/29/2020 at Unknown time  . apixaban (ELIQUIS) 5 MG TABS tablet Take 2 tablets (10 mg total) by mouth 2 (two) times daily for 7 days. 60 tablet 0   . oxyCODONE 10 MG TABS Take 1 tablet (10 mg total) by mouth every 6 (six) hours as needed for moderate pain, severe pain or breakthrough pain. 15 tablet 0     Assessment: Pharmacy consulted to dose heparin in patient with PE.  history of DVT and pulmonary embolism in 04/2020. Patient is on apixaban prior to admission with last dose given 08/29/20.  HL 0.21- subtherapeutic   Goal of Therapy:  HL 0.3-0.7 Monitor platelets by anticoagulation protocol: Yes   Plan:  Heparin 1500 unit IV bolus x 1 Increase heparin infusion to 1450  units/hr Check anti-Xa level in 6 hours and daily while on heparin Continue to monitor H&H and platelets.  10/29/20, PharmD Clinical Pharmacist 09/05/2020 8:37 AM

## 2020-09-05 NOTE — Progress Notes (Signed)
ANTICOAGULATION CONSULT NOTE -   Pharmacy Consult for heparin Indication: pulmonary embolus  Allergies  Allergen Reactions  . Bee Venom Anaphylaxis  . Banana   . 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    Patient Measurements: Height: 5\' 5"  (165.1 cm) Weight: 74.8 kg (165 lb) IBW/kg (Calculated) : 61.5 Heparin Dosing Weight: 75 kg  Vital Signs: Temp: 99 F (37.2 C) (10/19 0533) BP: 120/71 (10/19 0533) Pulse Rate: 91 (10/19 0533)  Labs: Recent Labs    09/03/20 1913 09/03/20 2152 09/03/20 2152 09/04/20 0447 09/04/20 0447 09/04/20 1113 09/05/20 0644 09/05/20 1500  HGB 15.1  --    < > 13.6  --   --  12.5*  --   HCT 44.6  --   --  40.5  --   --  39.1  --   PLT 207  --   --  210  --   --  244  --   HEPARINUNFRC  --  <0.10*   < > 0.71*   < > 0.46 0.21* 0.29*  CREATININE 1.08  --   --  1.01  --   --  1.00  --   TROPONINIHS 3 4  --   --   --   --   --   --    < > = values in this interval not displayed.    Estimated Creatinine Clearance: 83.5 mL/min (by C-G formula based on SCr of 1 mg/dL).   Medical History: Past Medical History:  Diagnosis Date  . DVT (deep venous thrombosis) (HCC)     Medications:  Medications Prior to Admission  Medication Sig Dispense Refill Last Dose  . apixaban (ELIQUIS) 5 MG TABS tablet Take 1 tablet (5 mg total) by mouth 2 (two) times daily. 60 tablet 3 08/29/2020 at Unknown time  . apixaban (ELIQUIS) 5 MG TABS tablet Take 2 tablets (10 mg total) by mouth 2 (two) times daily for 7 days. 60 tablet 0   . oxyCODONE 10 MG TABS Take 1 tablet (10 mg total) by mouth every 6 (six) hours as needed for moderate pain, severe pain or breakthrough pain. 15 tablet 0     Assessment: Pharmacy consulted to dose heparin in patient with PE.  history of DVT and pulmonary embolism in 04/2020. Patient is on apixaban prior to admission with last dose given 08/29/20.  HL 0.29- subtherapeutic   Goal of Therapy:  HL 0.3-0.7 Monitor platelets by anticoagulation  protocol: Yes   Plan:  Heparin 1500 unit IV bolus x 1 Increase heparin infusion to 1600 units/hr Check anti-Xa level in 6 hours and daily while on heparin Continue to monitor H&H and platelets.  10/29/20, PharmD Clinical Pharmacist 09/05/2020 4:22 PM

## 2020-09-05 NOTE — Progress Notes (Signed)
ANTICOAGULATION CONSULT NOTE -   Pharmacy Consult for heparin Indication: pulmonary embolus  Assessment: Pharmacy consulted to dose heparin in patient with PE.  history of DVT and pulmonary embolism in 04/2020. Patient is on apixaban prior to admission with last dose given 08/29/20.  HL 0.55   Goal of Therapy:  HL 0.3-0.7 Monitor platelets by anticoagulation protocol: Yes   Plan:  Continue heparin infusion at 1600 units/hr Check anti-Xa level daily while on heparin Continue to monitor H&H and platelets.  Talbert Cage, PharmD Clinical Pharmacist 09/05/2020 10:59 PM

## 2020-09-05 NOTE — Progress Notes (Signed)
[facility]  PROGRESS NOTE  Terrence Christian WJX:[id number] DOB: Apr 17, 1971 DOA: 09/03/2020 PCP: Terrence Christian Admit date - 09/03/2020   Admitting Physician Terrence GilfordAsia B Zierle-Ghosh, DO  Outpatient Primary MD for the patient is Terrence Christian  LOS - 2 Brief Narrative  Mr. Terrence Christian is a 49 year old male with medical history significant for asthma, tobacco abuse, spider bite followed by bilateral acute PE with bilateral DVT (04/2020) presumed to be provoked after 10-hour drive on Eliquis who presented to the ED on 10/17 with worsening shortness of breath and left leg pain x2 days after being nonadherent with his Eliquis over the past 2 weeks as well as admitted increased alcohol intake (admits to sixpack of beer daily as well as drinking "1/5 of liquor every day to deal with the pain").  Patient was admitted with working diagnosis of acute hypoxic respiratory failure presumed secondary to potential current PE in the setting of nonadherence to Eliquis as well as possible CAP complicated by high concern for alcohol withdrawal.    Subjective  Reports significant improvement in left leg pain.  Denies any chest pain or shortness of breath. A & P   Acute hypoxic respiratory failure likely multifactorial etiology including possible recurrent PE as well as high concern for atypical respiratory infection based off CTA imaging, resolved.  Covid test negative.  Briefly required 2 L in the ED now on room air.  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  Initially diagnosed 04/2020.  Presented with shortness of breath in the setting of poor  adherence to Eliquis over the past 2 weeks.  CTA is not definitive but notes no interval worsening of clot burden and possible interval resolution versus recurrence.  Venous duplex shows persistent bilateral DVTs with no changes in appearance. No signs of compartment syndrome, painful but palpable pulses and neurologically intact.  TTE shows preserved EF though some concern for right ventricular pressure overload given flattening of septal with PASP of 36 mmHg consistent with mild pulmonary hypertension, right ventricular systolic function is within normal limits -Currently no O2 requirements --Serial exams to monitor while on IV heparin, no current indication for thrombolysis but has occlusive L leg DVT and given patient was nonadherent needs to ensure therapeutic anticoagulation before transitioning to oral anticoagulation (similar approach was recommended by Pulmonology on last admission in 04/2020)  Admitted alcohol abuse.  Currently not displaying any symptoms of withdrawal.  Admits last drink was day of hospitalization, ethanol level less than 10 -Closely monitor on CIWA protocol currently receiving IV Ativan every 6 hours for first 48, -Ativan as needed according to CIWA protocol  Leukocytosis, improving.  Down from 20,000 to11,000.  Remains afebrile.  Potential atypical respiratory infection is noted on CTA.  Now on room air with no respiratory distress, otherwise no localizing signs or symptoms of infection -Continue azithromycin, ceftriaxone -Monitor blood cultures  Polysubstance abuse.  UDS positive for cocaine and THC on admission -Counseling provided on admission   Family Communication  : None  Code Status : Full  Disposition Plan  :  Patient is from home. Anticipated d/c date: 2 to 3 days. Barriers to d/c or necessity for inpatient status:  Need to continue  IV hyperpneic ensure therapeutic levels, close monitoring of breathing status, close monitoring of mental status given high  concern for potential for alcohol withdrawal, continue IV antibiotics for presumed pneumonia Consults  : None  Procedures  : TTE, venous duplex  DVT Prophylaxis  : IV heparin  MDM: The below labs and imaging reports were reviewed and summarized above.  Medication management as above.  Lab Results  Component Value Date   PLT 244 09/05/2020    Diet :  Diet Order            Diet Heart Room service appropriate? Yes; Fluid consistency: Thin  Diet effective now                  Inpatient Medications Scheduled Meds: . folic acid  1 mg Oral Daily  . LORazepam  0-4 mg Intravenous Q6H   Followed by  . LORazepam  0-4 mg Intravenous Q12H  . multivitamin with minerals  1 tablet Oral Daily  . thiamine  100 mg Oral Daily   Or  . thiamine  100 mg Intravenous Daily   Continuous Infusions: . azithromycin 500 mg (09/04/20 2308)  . cefTRIAXone (ROCEPHIN)  IV 2 g (09/04/20 2213)  . heparin 1,450 Units/hr (09/05/20 0940)   PRN Meds:.LORazepam **OR** LORazepam, morphine injection, oxyCODONE  Antibiotics  :   Anti-infectives (From admission, onward)   Start     Dose/Rate Route Frequency Ordered Stop   09/04/20 2230  cefTRIAXone (ROCEPHIN) 2 g in sodium chloride 0.9 % 100 mL IVPB        2 g 200 mL/hr over 30 Minutes Intravenous Every 24 hours 09/03/20 2313 09/09/20 2229   09/04/20 2230  azithromycin (ZITHROMAX) 500 mg in sodium chloride 0.9 % 250 mL IVPB  Status:  Discontinued        500 mg 250 mL/hr over 60 Minutes Intravenous Every 24 hours 09/03/20 2313 09/04/20 0738   09/04/20 2230  azithromycin (ZITHROMAX) 500 mg in sodium chloride 0.9 % 250 mL IVPB        500 mg 250 mL/hr over 60 Minutes Intravenous Every 24 hours 09/04/20 0738 09/08/20 2229   09/03/20 2145  cefTRIAXone (ROCEPHIN) 1 g in sodium chloride 0.9 % 100 mL IVPB        1 g 200 mL/hr over 30 Minutes Intravenous  Once 09/03/20 2144 09/03/20 2314   09/03/20 2145  azithromycin (ZITHROMAX) 500 mg in sodium chloride 0.9 %  250 mL IVPB        500 mg 250 mL/hr over 60 Minutes Intravenous  Once 09/03/20 2144 09/04/20 0144       Objective   Vitals:   09/04/20 1343 09/04/20 2132 09/05/20 0100 09/05/20 0533  BP: 119/73 113/70 116/69 120/71  Pulse: 92 92 90 91  Resp: 16 18 16 18   Temp: 98.3 F (36.8 C) 98.4 F (36.9 C) 98.2 F (36.8 C) 99 F (37.2 C)  TempSrc:      SpO2: 98% 92% 100% 94%  Weight:      Height:        SpO2: 94 %  Wt Readings from Last 3 Encounters:  09/03/20 74.8 kg  04/24/20 71 kg     Intake/Output Summary (Last 24 hours) at 09/05/2020 1457 Last data filed at 09/05/2020 0753 Gross Christian 24 hour  Intake 1127.67 ml  Output 1800 ml  Net -672.33 ml    Physical Exam:     Awake Alert, Oriented X 3, Normal affect No new F.N deficits,  [place].AT, Normal respiratory effort on air, CTAB RRR,No Gallops,Rubs or new Murmurs, no peripheral edema +ve B.Sounds, Abd Soft, No tenderness, No rebound, guarding or rigidity. No Cyanosis, No new Rash or bruise   Peripheral pulses intact bilaterally in lower extremities   I have personally reviewed the following:   Data Reviewed:  CBC Recent Labs  Lab 09/03/20 1913 09/04/20 0447 09/05/20 0644  WBC 16.6* 20.2* 11.7*  HGB 15.1 13.6 12.5*  HCT 44.6 40.5 39.1  PLT 207 210 244  MCV 88.7 90.0 91.4  MCH 30.0 30.2 29.2  MCHC 33.9 33.6 32.0  RDW 15.7* 15.7* 15.5  LYMPHSABS 1.2  --   --   MONOABS 0.7  --   --   EOSABS 0.0  --   --   BASOSABS 0.1  --   --     Chemistries  Recent Labs  Lab 09/03/20 1913 09/04/20 0447 09/05/20 0644  NA 132* 135 136  K 3.7 3.7 3.5  CL 94* 99 102  CO2 24 25 26   GLUCOSE 118* 115* 113*  BUN 10 10 12   CREATININE 1.08 1.01 1.00  CALCIUM 9.9 9.0 8.8*  MG  --  1.8  --   AST  --  20  --   ALT  --  14  --   ALKPHOS  --  37*  --   BILITOT  --  0.8  --    ------------------------------------------------------------------------------------------------------------------ No results for input(s):  CHOL, HDL, LDLCALC, TRIG, CHOLHDL, LDLDIRECT in the last 72 hours.  No results found for: HGBA1C ------------------------------------------------------------------------------------------------------------------ No results for input(s): TSH, T4TOTAL, T3FREE, THYROIDAB in the last 72 hours.  Invalid input(s): FREET3 ------------------------------------------------------------------------------------------------------------------ No results for input(s): VITAMINB12, FOLATE, FERRITIN, TIBC, IRON, RETICCTPCT in the last 72 hours.  Coagulation profile No results for input(s): INR, PROTIME in the last 168 hours.  No results for input(s): DDIMER in the last 72 hours.  Cardiac Enzymes No results for input(s): CKMB, TROPONINI, MYOGLOBIN in the last 168 hours.  Invalid input(s): CK ------------------------------------------------------------------------------------------------------------------    Component Value Date/Time   BNP 14.0 04/19/2020 1050    Micro Results Recent Results (from the past 240 hour(s))  Resp Panel by RT PCR (RSV, Flu A&B, Covid) - Nasopharyngeal Swab     Status: None   Collection Time: 09/03/20  7:13 PM   Specimen: Nasopharyngeal Swab  Result Value Ref Range Status   SARS Coronavirus 2 by RT PCR NEGATIVE NEGATIVE Final    Comment: (NOTE) SARS-CoV-2 target nucleic acids are NOT DETECTED.  The SARS-CoV-2 RNA is generally detectable in upper respiratoy specimens during the acute phase of infection. The lowest concentration of SARS-CoV-2 viral copies this assay can detect is 131 copies/mL. A negative result does not preclude SARS-Cov-2 infection and should not be used as the sole basis for treatment or other patient management decisions. A negative result may occur with  improper specimen collection/handling, submission of specimen other than nasopharyngeal swab, presence of viral mutation(s) within the areas targeted by this assay, and inadequate number of viral  copies (<131 copies/mL). A negative result must be combined with clinical observations, patient history, and epidemiological information. The expected result is Negative.  Fact Sheet for Patients:  06/19/2020  Fact Sheet for Healthcare Providers:  09/05/20  This test is no t yet approved or cleared by the https://www.moore.com/ FDA and  has been authorized for detection and/or diagnosis of SARS-CoV-2 by FDA under an Emergency Use Authorization (EUA). This EUA will remain  in effect (meaning this  test can be used) for the duration of the COVID-19 declaration under Section 564(b)(1) of the Act, 21 U.S.C. section 360bbb-3(b)(1), unless the authorization is terminated or revoked sooner.     Influenza A by PCR NEGATIVE NEGATIVE Final   Influenza B by PCR NEGATIVE NEGATIVE Final    Comment: (NOTE) The Xpert Xpress SARS-CoV-2/FLU/RSV assay is intended as an aid in  the diagnosis of influenza from Nasopharyngeal swab specimens and  should not be used as a sole basis for treatment. Nasal washings and  aspirates are unacceptable for Xpert Xpress SARS-CoV-2/FLU/RSV  testing.  Fact Sheet for Patients: https://www.moore.com/  Fact Sheet for Healthcare Providers: https://www.young.biz/  This test is not yet approved or cleared by the [place] FDA and  has been authorized for detection and/or diagnosis of SARS-CoV-2 by  FDA under an Emergency Use Authorization (EUA). This EUA will remain  in effect (meaning this test can be used) for the duration of the  Covid-19 declaration under Section 564(b)(1) of the Act, 21  U.S.C. section 360bbb-3(b)(1), unless the authorization is  terminated or revoked.    Respiratory Syncytial Virus by PCR NEGATIVE NEGATIVE Final    Comment: (NOTE) Fact Sheet for Patients: https://www.moore.com/  Fact Sheet for Healthcare  Providers: https://www.young.biz/  This test is not yet approved or cleared by the [place] FDA and  has been authorized for detection and/or diagnosis of SARS-CoV-2 by  FDA under an Emergency Use Authorization (EUA). This EUA will remain  in effect (meaning this test can be used) for the duration of the  COVID-19 declaration under Section 564(b)(1) of the Act, 21 U.S.C.  section 360bbb-3(b)(1), unless the authorization is terminated or  revoked. Performed at [facility], [address] [place], [place] [address]   Culture, blood (routine x 2)     Status: None (Preliminary result)   Collection Time: 09/03/20  9:52 PM   Specimen: BLOOD LEFT ARM  Result Value Ref Range Status   Specimen Description BLOOD LEFT ARM  Final   Special Requests   Final    BOTTLES DRAWN AEROBIC AND ANAEROBIC Blood Culture adequate volume   Culture   Final    NO GROWTH 2 DAYS Performed at [facility], [address], [place], [place] [address]    Report Status PENDING  Incomplete  Culture, blood (routine x 2)     Status: None (Preliminary result)   Collection Time: 09/03/20 10:00 PM   Specimen: Left Antecubital; Blood  Result Value Ref Range Status   Specimen Description LEFT ANTECUBITAL  Final   Special Requests   Final    BOTTLES DRAWN AEROBIC AND ANAEROBIC Blood Culture adequate volume   Culture   Final    NO GROWTH 2 DAYS Performed at [facility], [address] [place], [place] [address]    Report Status PENDING  Incomplete    Radiology Reports CT Angio Chest PE W and/or Wo Contrast  Addendum Date: 09/03/2020   ADDENDUM REPORT: 09/03/2020 20:55 ADDENDUM: Critical Value/emergent results were called by telephone at the time of interpretation on 09/03/2020 at 8:55 pm to Dr. Criss Alvine in the ED, who verbally acknowledged these results. Electronically Signed   By: Odessa Fleming M.D.   On: 09/03/2020 20:55   Result Date: 09/03/2020 CLINICAL DATA:  49 year old male with left lower  extremity DVT and PE diagnosed in June, ran out of Eliquis recently. Increased pain and numbness. EXAM: CT ANGIOGRAPHY CHEST WITH CONTRAST TECHNIQUE: Multidetector CT imaging of the chest was performed using the  standard protocol during bolus administration of intravenous contrast. Multiplanar CT image reconstructions and MIPs were obtained to evaluate the vascular anatomy. CONTRAST:  OMNIPAQUE IOHEXOL 350 MG/ML SOLN COMPARISON:  CTA chest 04/19/2020.  Portable chest x-ray tonight. FINDINGS: Cardiovascular: Suboptimal contrast bolus timing in the pulmonary arterial tree. Improved bilateral pulmonary artery patency since June although bilateral lobar and segmental pulmonary thrombus persists (left lower lobe series 5, image 119, left upper lobe image 99, right lower lobe image 133. Much of this appears nonocclusive now. There is no saddle embolus. Heart size is stable. No pericardial effusion. Negative visible aorta. Mediastinum/Nodes: Mediastinal lymph nodes remain within normal limits. Lungs/Pleura: Major airways are patent and lung volumes are similar to June. Small pleural effusions have regressed. However, there is new symmetric bilateral confluent pulmonary ground-glass opacity with an unusual pattern sparing the subpleural lung parenchyma bilaterally (series 6, image 45, 69). Symmetric involvement of the superior lower lobe segments, but the lung bases are relatively spared. Upper Abdomen: Negative visible liver, gallbladder, spleen, pancreas, adrenal glands, kidneys and bowel in the upper abdomen. Musculoskeletal: Stable thoracic endplate degeneration. No acute osseous abnormality identified. Review of the MIP images confirms the above findings. IMPRESSION: 1. Regressed but not resolved bilateral pulmonary emboli since June. Residual bilateral lobar and segmental thrombus which mostly appears nonocclusive today. This could be incompletely resolved clot versus recurrent Acute PE. 2. New symmetric and  confluent pulmonary ground-glass opacity but with an unusual pattern sparing of the subpleural lung and lung bases bilaterally. No pleural effusion. Differential considerations include viral and atypical respiratory infection (including mycoplasma, adenovirus, influenza), but also noninfectious inflammation such as pulmonary alveolar proteinosis, hypersensitivity pneumonitis. Recommend excluding COVID-19. Electronically Signed: By: Odessa Fleming M.D. On: 09/03/2020 20:47   US Venous Img Lower Bilateral (DVT)  Result Date: 09/04/2020 CLINICAL DATA:  Pulmonary emboli. EXAM: BILATERAL LOWER EXTREMITY VENOUS DOPPLER ULTRASOUND TECHNIQUE: Gray-scale sonography with compression, as well as color and duplex ultrasound, were performed to evaluate the deep venous system(s) from the level of the common femoral vein through the popliteal and proximal calf veins. COMPARISON:  Bilateral lower extremity ultrasound 04/19/2020. FINDINGS: VENOUS Right lower extremity: There is partially occlusive thrombus seen in the popliteal vein. Thrombus in the femoral vein and calf veins is no longer visualized. No new thrombus. Left lower extremity: Occlusive thrombus is seen in the common femoral, profundus femoral, femoral and popliteal veins as well as in the peroneal vein. The appearance is not markedly changed. OTHER None. Limitations: none IMPRESSION: Persistent bilateral deep venous thrombosis is much worse on the left. The appearance of the right leg is improved. There has been little change in the extensive thrombus in the left leg. Electronically Signed   By: Drusilla Kanner M.D.   On: 09/04/2020 12:06   DG Chest Port 1 View  Result Date: 09/03/2020 CLINICAL DATA:  Shortness of breath EXAM: PORTABLE CHEST 1 VIEW COMPARISON:  CT 04/19/2020 FINDINGS: Heart and mediastinal contours are within normal limits. No focal opacities or effusions. No acute bony abnormality. IMPRESSION: No active disease. Electronically Signed   By: Charlett Nose M.D.   On: 09/03/2020 19:49   ECHOCARDIOGRAM COMPLETE  Result Date: 09/04/2020    ECHOCARDIOGRAM REPORT   Patient Name:   SHIN LAMOUR Date of Exam: 09/04/2020 Medical Rec #:  [id number]    Height:       65.0 in Accession #:    [id number]   Weight:       165.0 lb Date of Birth:  [birth year]   BSA:          1.823 m Patient Age:    48 years     BP:           118/79 mmHg Patient Gender: M            HR:           86 bpm. [facility] Procedure: 2D Echo Indications:    Pulmonary Embolus  History:        Patient has prior history of Echocardiogram examinations, most                 recent 04/19/2020. Risk Factors:Current Smoker. Pulmonary Emboli,                 Substance Abuse.  Sonographer:    Jeryl Columbia RDCS (AE) Referring Phys: [id number] ASIA B Christian IMPRESSIONS  1. Left ventricular ejection fraction, by estimation, is 65 to 70%. The left ventricle has normal function. The left ventricle has no regional wall motion abnormalities. Left ventricular diastolic parameters are consistent with Grade I diastolic dysfunction (impaired relaxation).  2. Ventricular septum is flattened in systole consistent with RV pressure overload. . Right ventricular systolic function is normal. The right ventricular size is mildly enlarged.  3. Right atrial size was mildly dilated.  4. The mitral valve is normal in structure. No evidence of mitral valve regurgitation. No evidence of mitral stenosis.  5. The aortic valve is tricuspid. Aortic valve regurgitation is not visualized. No aortic stenosis is present.  6. Mild pulmonary HTN, PASP is 36 mmHg.  7. The inferior vena cava is normal in size with greater than 50% respiratory variability, suggesting right atrial pressure of 3 mmHg. FINDINGS  Left Ventricle: Left ventricular ejection fraction, by estimation, is 65 to 70%. The left ventricle has normal function. The left ventricle has no regional wall motion abnormalities. The left ventricular internal cavity  size was normal in size. There is  no left ventricular hypertrophy. Left ventricular diastolic parameters are consistent with Grade I diastolic dysfunction (impaired relaxation). Normal left ventricular filling pressure. Right Ventricle: Ventricular septum is flattened in systole consistent with RV pressure overload. The right ventricular size is mildly enlarged. No increase in right ventricular wall thickness. Right ventricular systolic function is normal. Left Atrium: Left atrial size was normal in size. Right Atrium: Right atrial size was mildly dilated. Pericardium: There is no evidence of pericardial effusion. Mitral Valve: The mitral valve is normal in structure. No evidence of mitral valve regurgitation. No evidence of mitral valve stenosis. Tricuspid Valve: The tricuspid valve is normal in structure. Tricuspid valve regurgitation is mild . No evidence of tricuspid stenosis. Aortic Valve: The aortic valve is tricuspid. Aortic valve regurgitation is not visualized. No aortic stenosis is present. Aortic valve mean gradient measures 3.5 mmHg. Aortic valve peak gradient measures 7.0 mmHg. Aortic valve area, by VTI measures 2.68 cm. Pulmonic Valve: The pulmonic valve was not well visualized. Pulmonic valve regurgitation is not visualized. No evidence of pulmonic stenosis. Aorta: The aortic root is normal in size and structure. Pulmonary Artery: Mild pulmonary HTN, PASP is 36 mmHg. Venous: The inferior vena cava is normal in size with greater than 50% respiratory variability, suggesting right atrial pressure of 3 mmHg. IAS/Shunts: No atrial level shunt detected by color flow Doppler.  LEFT VENTRICLE PLAX 2D LVIDd:         3.99 cm  Diastology LVIDs:  2.56 cm  LV e' medial:    5.87 cm/s LV PW:         1.00 cm  LV E/e' medial:  11.4 LV IVS:        1.00 cm  LV e' lateral:   10.10 cm/s LVOT diam:     2.10 cm  LV E/e' lateral: 6.6 LV SV:         56 LV SV Index:   31 LVOT Area:     3.46 cm  RIGHT VENTRICLE RV S  prime:     16.20 cm/s TAPSE (M-mode): 2.4 cm LEFT ATRIUM             Index       RIGHT ATRIUM           Index LA diam:        2.80 cm 1.54 cm/m  RA Area:     18.70 cm LA Vol (A2C):   30.1 ml 16.51 ml/m RA Volume:   59.30 ml  32.53 ml/m LA Vol (A4C):   23.3 ml 12.78 ml/m LA Biplane Vol: 27.5 ml 15.09 ml/m  AORTIC VALVE AV Area (Vmax):    2.44 cm AV Area (Vmean):   2.31 cm AV Area (VTI):     2.68 cm AV Vmax:           131.87 cm/s AV Vmean:          86.280 cm/s AV VTI:            0.210 m AV Peak Grad:      7.0 mmHg AV Mean Grad:      3.5 mmHg LVOT Vmax:         92.98 cm/s LVOT Vmean:        57.610 cm/s LVOT VTI:          0.162 m LVOT/AV VTI ratio: 0.77  AORTA Ao Root diam: 3.10 cm MITRAL VALVE               TRICUSPID VALVE MV Area (PHT): 3.61 cm    TR Peak grad:   44.4 mmHg MV Decel Time: 210 msec    TR Vmax:        333.00 cm/s MV E velocity: 66.80 cm/s MV A velocity: 72.00 cm/s  SHUNTS MV E/A ratio:  0.93        Systemic VTI:  0.16 m                            Systemic Diam: 2.10 cm Dina Rich MD Electronically signed by Dina Rich MD Signature Date/Time: 09/04/2020/12:43:50 PM    Final      Time Spent in minutes  30     Laverna Peace M.D on 09/05/2020 at 2:57 PM  To page go to [email or web address] - password [facility]

## 2020-09-06 DIAGNOSIS — I2699 Other pulmonary embolism without acute cor pulmonale: Principal | ICD-10-CM

## 2020-09-06 LAB — CBC
HCT: 39.6 % (ref 39.0–52.0)
Hemoglobin: 12.6 g/dL — ABNORMAL LOW (ref 13.0–17.0)
MCH: 29.2 pg (ref 26.0–34.0)
MCHC: 31.8 g/dL (ref 30.0–36.0)
MCV: 91.9 fL (ref 80.0–100.0)
Platelets: 284 10*3/uL (ref 150–400)
RBC: 4.31 MIL/uL (ref 4.22–5.81)
RDW: 15.3 % (ref 11.5–15.5)
WBC: 9.7 10*3/uL (ref 4.0–10.5)
nRBC: 0 % (ref 0.0–0.2)

## 2020-09-06 LAB — GLUCOSE, CAPILLARY: Glucose-Capillary: 96 mg/dL (ref 70–99)

## 2020-09-06 LAB — HEPARIN LEVEL (UNFRACTIONATED): Heparin Unfractionated: 0.53 IU/mL (ref 0.30–0.70)

## 2020-09-06 MED ORDER — APIXABAN 5 MG PO TABS: 5.0000 mg | ORAL_TABLET | Freq: Two times a day (BID) | ORAL | 12 refills | Status: AC

## 2020-09-06 MED ORDER — APIXABAN 5 MG PO TABS: 10.0000 mg | ORAL_TABLET | Freq: Two times a day (BID) | ORAL | 0 refills | Status: AC

## 2020-09-06 MED ORDER — OXYCODONE HCL 10 MG PO TABS: 10.0000 mg | ORAL_TABLET | Freq: Four times a day (QID) | ORAL | 0 refills | Status: AC | PRN

## 2020-09-06 MED ORDER — LEVOFLOXACIN 750 MG PO TABS: 750.0000 mg | ORAL_TABLET | Freq: Every day | ORAL | 0 refills | Status: AC

## 2020-09-06 MED ORDER — APIXABAN 5 MG PO TABS: 5.0000 mg | ORAL_TABLET | Freq: Two times a day (BID) | ORAL | Status: DC

## 2020-09-06 MED ORDER — APIXABAN 5 MG PO TABS
10.0000 mg | ORAL_TABLET | Freq: Two times a day (BID) | ORAL | Status: DC
  Administered 2020-09-06: 10 mg via ORAL
  Filled 2020-09-06: qty 2

## 2020-09-06 NOTE — Progress Notes (Signed)
Discharge instructions reviewed with patient. Pt aware he has a referral to [facility] and is to reach out after he is discharged, as he is uninsured and does not have a primary care physician for medication management/follow up. States he understands his provided medication instructions and self management at this time. PIV access discontinued. Pt contact, Tammy Sours, made aware of discharge.

## 2020-09-06 NOTE — TOC Transition Note (Signed)
Transition of Care [facility]) - CM/SW Discharge Note   Patient Details  Name: Cerrone Debold MRN: [id number] Date of Birth: 1971/06/07  [facility] CM/SW Contact:  Karn Cassis, LCSW Phone Number: 09/06/2020, 10:37 AM   Clinical Narrative:  Pt d/c today. LCSW spoke with pt who states he will call someone to pick him up when ready. Referral made on 10/18 to [facility] and financial counselor. No other TOC needs reported.    Final next level of care: Home/Self Care Barriers to Discharge: Barriers Resolved   Patient Goals and CMS Choice Patient states their goals for this hospitalization and ongoing recovery are:: return home      Discharge Placement                  Name of family member notified: pt only Patient and family notified of of transfer: 09/06/20  Discharge Plan and Services In-house Referral: Clinical Social Work                                   Social Determinants of Health (SDOH) Interventions     Readmission Risk Interventions No flowsheet data found.

## 2020-09-06 NOTE — Progress Notes (Signed)
ANTICOAGULATION CONSULT NOTE -   Pharmacy Consult for heparin Indication: pulmonary embolus  Allergies  Allergen Reactions  . Bee Venom Anaphylaxis  . Banana   . 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    Patient Measurements: Height: 5\' 5"  (165.1 cm) Weight: 74.8 kg (165 lb) IBW/kg (Calculated) : 61.5 Heparin Dosing Weight: 75 kg  Vital Signs: Temp: 98 F (36.7 C) (10/20 0520) Temp Source: Oral (10/20 0520) BP: 111/79 (10/20 0520) Pulse Rate: 80 (10/20 0520)  Labs: Recent Labs    09/03/20 1913 09/03/20 1913 09/03/20 2152 09/04/20 0447 09/04/20 1113 09/05/20 0644 09/05/20 0644 09/05/20 1500 09/05/20 2240 09/06/20 0403  HGB 15.1   < >  --  13.6  --  12.5*  --   --   --  12.6*  HCT 44.6   < >  --  40.5  --  39.1  --   --   --  39.6  PLT 207   < >  --  210  --  244  --   --   --  284  HEPARINUNFRC  --    < > <0.10* 0.71*   < > 0.21*   < > 0.29* 0.55 0.53  CREATININE 1.08  --   --  1.01  --  1.00  --   --   --   --   TROPONINIHS 3  --  4  --   --   --   --   --   --   --    < > = values in this interval not displayed.    Estimated Creatinine Clearance: 83.5 mL/min (by C-G formula based on SCr of 1 mg/dL).   Medical History: Past Medical History:  Diagnosis Date  . DVT (deep venous thrombosis) (HCC)     Medications:  Medications Prior to Admission  Medication Sig Dispense Refill Last Dose  . apixaban (ELIQUIS) 5 MG TABS tablet Take 1 tablet (5 mg total) by mouth 2 (two) times daily. 60 tablet 3 08/29/2020 at Unknown time  . apixaban (ELIQUIS) 5 MG TABS tablet Take 2 tablets (10 mg total) by mouth 2 (two) times daily for 7 days. 60 tablet 0   . oxyCODONE 10 MG TABS Take 1 tablet (10 mg total) by mouth every 6 (six) hours as needed for moderate pain, severe pain or breakthrough pain. 15 tablet 0     Assessment: Pharmacy consulted to dose heparin in patient with PE.  history of DVT and pulmonary embolism in 04/2020. Patient is on apixaban prior to admission with last  dose given 08/29/20.  HL 0.53- remains therapeutic  Goal of Therapy:  HL 0.3-0.7 Monitor platelets by anticoagulation protocol: Yes   Plan:  Continue heparin infusion at 1600 units/hr Check anti-Xa level daily while on heparin Continue to monitor H&H and platelets.  10/29/20, BS Pharm D, [facility] Clinical Pharmacist Pager [phone] 09/06/2020 7:53 AM

## 2020-09-06 NOTE — Progress Notes (Signed)
Pt does not have a cell phone

## 2020-09-06 NOTE — Discharge Summary (Signed)
Physician Discharge Summary  Terrence Christian ZOX:[id number] DOB: 1971-05-29 DOA: 09/03/2020  PCP: Patient, [name]  Admit date: 09/03/2020 Discharge date: 09/06/2020  Time spent: 22  Minutes  Recommendations for Outpatient Follow-up:  1. Will need to follow-up with PCP and/or health department to get refills on meds  2. Full prescription of 1 year of apixaban however called in for patient and he can pick this up at the pharmacy 3. Needs CBC Chem-12 in about 1 week 4. Recommend outpatient detox screening  Discharge Diagnoses:  Active Problems:   Pulmonary emboli (HCC)   DVT, bilateral lower limbs (HCC)   Acute respiratory failure with hypoxia (HCC)   Polysubstance abuse (HCC)   Alcohol abuse   Leukocytosis   Pneumonia   Discharge Condition: Improved  Diet recommendation: Heart healthy  Filed Weights   09/03/20 1853  Weight: 74.8 kg    History of present illness:  49 year old black male known tobacco abuse asthma extensive bilateral acute pulmonary emboli previously diagnosed during hospitalization 6/2 through 6/7 He was not a candidate at that time for TPA-he had echocardiogram showing indeterminate LV function bilateral DVT in addition He was recently incarcerated and was placed in prison and when he left prison he was unable to find his anticoagulation but did not think to get refills Came back to the emergency room 10/17 with sudden shortness of breath subjective chills fever He had a leukocytosis on admission of 16 opacities consistent possibly with infectious etiology  Patient was treated with azithromycin ceftriaxone and kept on IV heparin given bilateral pulmonary emboli He was not needing oxygen on day of discharge and it was felt that he has been hemodynamically stabilized and given 3 days of anticoagulation I did place him back on graded doses of Eliquis 10 twice daily for 7 days and then he should go to 5 mg daily For his probable pneumonia although he did not  really have sputum cough or oxygen requirement switched him over to Levaquin which she will complete in the outpatient setting He does not have any pain meds and clearly explained to him that we will give him a very limited prescription but he will need outpatient follow-up with PCP in order to get more if he needs it He was discharged home in a stable state on 10/20   Discharge Exam: Vitals:   09/05/20 2110 09/06/20 0520  BP: 123/83 111/79  Pulse: 88 80  Resp: 16 16  Temp: 98.9 F (37.2 C) 98 F (36.7 C)  SpO2: 96% 96%    General: Awake alert coherent no distress sitting up in bed Cardiovascular: S1-S2 no murmur no rub no gallop sinus rhythm does not seem to be in any cardiopulmonary embarrassment and is in normal sinus rhythm Respiratory: Clear no rales no rhonchi no adventitious sounds Abdomen soft no rebound no guarding No lower extremity edema  Discharge Instructions   Discharge Instructions    Diet - low sodium heart healthy   Complete by: As directed    Discharge instructions   Complete by: As directed    Will need 2 to 3 days pain medication which has been prescribed for you in the outpatient setting-your regular doctor will be the person to ask about longstanding refills Would recommend higher dose of your blood thinner for a week and then cut back to lower dose see instructions on charting carefully There was a concern raised about possible pneumonia when you were admitted although we do not think that this is confirmed-it would  be a good idea to complete Levaquin for 2 more days in the outpatient setting which is an antibiotic Please get labs in the next week at your doctor's office   Increase activity slowly   Complete by: As directed      Allergies as of 09/06/2020      Reactions   Bee Venom Anaphylaxis   Banana    Other    Pt allergic to tea      Medication List    TAKE these medications   apixaban 5 MG Tabs tablet Commonly known as: ELIQUIS Take 2  tablets (10 mg total) by mouth 2 (two) times daily for 7 days. What changed: Another medication with the same name was changed. Make sure you understand how and when to take each.   apixaban 5 MG Tabs tablet Commonly known as: ELIQUIS Take 1 tablet (5 mg total) by mouth 2 (two) times daily. Start taking on: September 12, 2020 What changed: These instructions start on September 12, 2020. If you are unsure what to do until then, ask your doctor or other care provider.   levofloxacin 750 MG tablet Commonly known as: Levaquin Take 1 tablet (750 mg total) by mouth daily for 10 days.   Oxycodone HCl 10 MG Tabs Take 1 tablet (10 mg total) by mouth every 6 (six) hours as needed. What changed: reasons to take this      Allergies  Allergen Reactions  . Bee Venom Anaphylaxis  . Banana   . 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      The results of significant diagnostics from this hospitalization (including imaging, microbiology, ancillary and laboratory) are listed below for reference.    Significant Diagnostic Studies: CT Angio Chest PE W and/or Wo Contrast  Addendum Date: 09/03/2020   ADDENDUM REPORT: 09/03/2020 20:55 ADDENDUM: Critical Value/emergent results were called by telephone at the time of interpretation on 09/03/2020 at 8:55 pm to Dr. Criss Alvine in the ED, who verbally acknowledged these results. Electronically Signed   By: Odessa Fleming M.D.   On: 09/03/2020 20:55   Result Date: 09/03/2020 CLINICAL DATA:  49 year old male with left lower extremity DVT and PE diagnosed in June, ran out of Eliquis recently. Increased pain and numbness. EXAM: CT ANGIOGRAPHY CHEST WITH CONTRAST TECHNIQUE: Multidetector CT imaging of the chest was performed using the standard protocol during bolus administration of intravenous contrast. Multiplanar CT image reconstructions and MIPs were obtained to evaluate the vascular anatomy. CONTRAST:  OMNIPAQUE IOHEXOL 350 MG/ML SOLN COMPARISON:  CTA chest 04/19/2020.   Portable chest x-ray tonight. FINDINGS: Cardiovascular: Suboptimal contrast bolus timing in the pulmonary arterial tree. Improved bilateral pulmonary artery patency since June although bilateral lobar and segmental pulmonary thrombus persists (left lower lobe series 5, image 119, left upper lobe image 99, right lower lobe image 133. Much of this appears nonocclusive now. There is no saddle embolus. Heart size is stable. No pericardial effusion. Negative visible aorta. Mediastinum/Nodes: Mediastinal lymph nodes remain within normal limits. Lungs/Pleura: Major airways are patent and lung volumes are similar to June. Small pleural effusions have regressed. However, there is new symmetric bilateral confluent pulmonary ground-glass opacity with an unusual pattern sparing the subpleural lung parenchyma bilaterally (series 6, image 45, 69). Symmetric involvement of the superior lower lobe segments, but the lung bases are relatively spared. Upper Abdomen: Negative visible liver, gallbladder, spleen, pancreas, adrenal glands, kidneys and bowel in the upper abdomen. Musculoskeletal: Stable thoracic endplate degeneration. No acute osseous abnormality identified.  Review of the MIP images confirms the above findings. IMPRESSION: 1. Regressed but not resolved bilateral pulmonary emboli since June. Residual bilateral lobar and segmental thrombus which mostly appears nonocclusive today. This could be incompletely resolved clot versus recurrent Acute PE. 2. New symmetric and confluent pulmonary ground-glass opacity but with an unusual pattern sparing of the subpleural lung and lung bases bilaterally. No pleural effusion. Differential considerations include viral and atypical respiratory infection (including mycoplasma, adenovirus, influenza), but also noninfectious inflammation such as pulmonary alveolar proteinosis, hypersensitivity pneumonitis. Recommend excluding COVID-19. Electronically Signed: By: Odessa Fleming M.D. On: 09/03/2020  20:47   US Venous Img Lower Bilateral (DVT)  Result Date: 09/04/2020 CLINICAL DATA:  Pulmonary emboli. EXAM: BILATERAL LOWER EXTREMITY VENOUS DOPPLER ULTRASOUND TECHNIQUE: Gray-scale sonography with compression, as well as color and duplex ultrasound, were performed to evaluate the deep venous system(s) from the level of the common femoral vein through the popliteal and proximal calf veins. COMPARISON:  Bilateral lower extremity ultrasound 04/19/2020. FINDINGS: VENOUS Right lower extremity: There is partially occlusive thrombus seen in the popliteal vein. Thrombus in the femoral vein and calf veins is no longer visualized. No new thrombus. Left lower extremity: Occlusive thrombus is seen in the common femoral, profundus femoral, femoral and popliteal veins as well as in the peroneal vein. The appearance is not markedly changed. OTHER None. Limitations: none IMPRESSION: Persistent bilateral deep venous thrombosis is much worse on the left. The appearance of the right leg is improved. There has been little change in the extensive thrombus in the left leg. Electronically Signed   By: Drusilla Kanner M.D.   On: 09/04/2020 12:06   DG Chest Port 1 View  Result Date: 09/03/2020 CLINICAL DATA:  Shortness of breath EXAM: PORTABLE CHEST 1 VIEW COMPARISON:  CT 04/19/2020 FINDINGS: Heart and mediastinal contours are within normal limits. No focal opacities or effusions. No acute bony abnormality. IMPRESSION: No active disease. Electronically Signed   By: Charlett Nose M.D.   On: 09/03/2020 19:49   ECHOCARDIOGRAM COMPLETE  Result Date: 09/04/2020    ECHOCARDIOGRAM REPORT   Patient Name:   RONNEY HONEYWELL Date of Exam: 09/04/2020 Medical Rec #:  [id number]    Height:       65.0 in Accession #:    [id number]   Weight:       165.0 lb Date of Birth:  Apr 17, 1971   BSA:          1.823 m Patient Age:    48 years     BP:           118/79 mmHg Patient Gender: M            HR:           86 bpm. [facility]  Procedure: 2D Echo Indications:    Pulmonary Embolus  History:        Patient has prior history of Echocardiogram examinations, most                 recent 04/19/2020. Risk Factors:Current Smoker. Pulmonary Emboli,                 Substance Abuse.  Sonographer:    Jeryl Columbia RDCS (AE) Referring Phys: [id number] ASIA B ZIERLE-GHOSH IMPRESSIONS  1. Left ventricular ejection fraction, by estimation, is 65 to 70%. The left ventricle has normal function. The left ventricle has no regional wall motion abnormalities. Left ventricular diastolic parameters are consistent with Grade I diastolic dysfunction (impaired relaxation).  2. Ventricular septum is flattened in systole consistent with RV pressure overload. . Right ventricular systolic function is normal. The right ventricular size is mildly enlarged.  3. Right atrial size was mildly dilated.  4. The mitral valve is normal in structure. No evidence of mitral valve regurgitation. No evidence of mitral stenosis.  5. The aortic valve is tricuspid. Aortic valve regurgitation is not visualized. No aortic stenosis is present.  6. Mild pulmonary HTN, PASP is 36 mmHg.  7. The inferior vena cava is normal in size with greater than 50% respiratory variability, suggesting right atrial pressure of 3 mmHg. FINDINGS  Left Ventricle: Left ventricular ejection fraction, by estimation, is 65 to 70%. The left ventricle has normal function. The left ventricle has no regional wall motion abnormalities. The left ventricular internal cavity size was normal in size. There is  no left ventricular hypertrophy. Left ventricular diastolic parameters are consistent with Grade I diastolic dysfunction (impaired relaxation). Normal left ventricular filling pressure. Right Ventricle: Ventricular septum is flattened in systole consistent with RV pressure overload. The right ventricular size is mildly enlarged. No increase in right ventricular wall thickness. Right ventricular systolic function is  normal. Left Atrium: Left atrial size was normal in size. Right Atrium: Right atrial size was mildly dilated. Pericardium: There is no evidence of pericardial effusion. Mitral Valve: The mitral valve is normal in structure. No evidence of mitral valve regurgitation. No evidence of mitral valve stenosis. Tricuspid Valve: The tricuspid valve is normal in structure. Tricuspid valve regurgitation is mild . No evidence of tricuspid stenosis. Aortic Valve: The aortic valve is tricuspid. Aortic valve regurgitation is not visualized. No aortic stenosis is present. Aortic valve mean gradient measures 3.5 mmHg. Aortic valve peak gradient measures 7.0 mmHg. Aortic valve area, by VTI measures 2.68 cm. Pulmonic Valve: The pulmonic valve was not well visualized. Pulmonic valve regurgitation is not visualized. No evidence of pulmonic stenosis. Aorta: The aortic root is normal in size and structure. Pulmonary Artery: Mild pulmonary HTN, PASP is 36 mmHg. Venous: The inferior vena cava is normal in size with greater than 50% respiratory variability, suggesting right atrial pressure of 3 mmHg. IAS/Shunts: No atrial level shunt detected by color flow Doppler.  LEFT VENTRICLE PLAX 2D LVIDd:         3.99 cm  Diastology LVIDs:         2.56 cm  LV e' medial:    5.87 cm/s LV PW:         1.00 cm  LV E/e' medial:  11.4 LV IVS:        1.00 cm  LV e' lateral:   10.10 cm/s LVOT diam:     2.10 cm  LV E/e' lateral: 6.6 LV SV:         56 LV SV Index:   31 LVOT Area:     3.46 cm  RIGHT VENTRICLE RV S prime:     16.20 cm/s TAPSE (M-mode): 2.4 cm LEFT ATRIUM             Index       RIGHT ATRIUM           Index LA diam:        2.80 cm 1.54 cm/m  RA Area:     18.70 cm LA Vol (A2C):   30.1 ml 16.51 ml/m RA Volume:   59.30 ml  32.53 ml/m LA Vol (A4C):   23.3 ml 12.78 ml/m LA Biplane Vol: 27.5 ml 15.09 ml/m  AORTIC  VALVE AV Area (Vmax):    2.44 cm AV Area (Vmean):   2.31 cm AV Area (VTI):     2.68 cm AV Vmax:           131.87 cm/s AV Vmean:           86.280 cm/s AV VTI:            0.210 m AV Peak Grad:      7.0 mmHg AV Mean Grad:      3.5 mmHg LVOT Vmax:         92.98 cm/s LVOT Vmean:        57.610 cm/s LVOT VTI:          0.162 m LVOT/AV VTI ratio: 0.77  AORTA Ao Root diam: 3.10 cm MITRAL VALVE               TRICUSPID VALVE MV Area (PHT): 3.61 cm    TR Peak grad:   44.4 mmHg MV Decel Time: 210 msec    TR Vmax:        333.00 cm/s MV E velocity: 66.80 cm/s MV A velocity: 72.00 cm/s  SHUNTS MV E/A ratio:  0.93        Systemic VTI:  0.16 m                            Systemic Diam: 2.10 cm Dina RichJonathan Branch MD Electronically signed by Dina RichJonathan Branch MD Signature Date/Time: 09/04/2020/12:43:50 PM    Final     Microbiology: Recent Results (from the past 240 hour(s))  Resp Panel by RT PCR (RSV, Flu A&B, Covid) - Nasopharyngeal Swab     Status: None   Collection Time: 09/03/20  7:13 PM   Specimen: Nasopharyngeal Swab  Result Value Ref Range Status   SARS Coronavirus 2 by RT PCR NEGATIVE NEGATIVE Final    Comment: (NOTE) SARS-CoV-2 target nucleic acids are NOT DETECTED.  The SARS-CoV-2 RNA is generally detectable in upper respiratoy specimens during the acute phase of infection. The lowest concentration of SARS-CoV-2 viral copies this assay can detect is 131 copies/mL. A negative result does not preclude SARS-Cov-2 infection and should not be used as the sole basis for treatment or other patient management decisions. A negative result may occur with  improper specimen collection/handling, submission of specimen other than nasopharyngeal swab, presence of viral mutation(s) within the areas targeted by this assay, and inadequate number of viral copies (<131 copies/mL). A negative result must be combined with clinical observations, patient history, and epidemiological information. The expected result is Negative.  Fact Sheet for Patients:  https://www.moore.com/https://www.fda.gov/media/142436/download  Fact Sheet for Healthcare Providers:   https://www.young.biz/https://www.fda.gov/media/142435/download  This test is no t yet approved or cleared by the [place] FDA and  has been authorized for detection and/or diagnosis of SARS-CoV-2 by FDA under an Emergency Use Authorization (EUA). This EUA will remain  in effect (meaning this test can be used) for the duration of the COVID-19 declaration under Section 564(b)(1) of the Act, 21 U.S.C. section 360bbb-3(b)(1), unless the authorization is terminated or revoked sooner.     Influenza A by PCR NEGATIVE NEGATIVE Final   Influenza B by PCR NEGATIVE NEGATIVE Final    Comment: (NOTE) The Xpert Xpress SARS-CoV-2/FLU/RSV assay is intended as an aid in  the diagnosis of influenza from Nasopharyngeal swab specimens and  should not be used as a sole basis for treatment. Nasal washings and  aspirates are unacceptable for  Xpert Xpress SARS-CoV-2/FLU/RSV  testing.  Fact Sheet for Patients: https://www.moore.com/  Fact Sheet for Healthcare Providers: https://www.young.biz/  This test is not yet approved or cleared by the [place] FDA and  has been authorized for detection and/or diagnosis of SARS-CoV-2 by  FDA under an Emergency Use Authorization (EUA). This EUA will remain  in effect (meaning this test can be used) for the duration of the  Covid-19 declaration under Section 564(b)(1) of the Act, 21  U.S.C. section 360bbb-3(b)(1), unless the authorization is  terminated or revoked.    Respiratory Syncytial Virus by PCR NEGATIVE NEGATIVE Final    Comment: (NOTE) Fact Sheet for Patients: https://www.moore.com/  Fact Sheet for Healthcare Providers: https://www.young.biz/  This test is not yet approved or cleared by the [place] FDA and  has been authorized for detection and/or diagnosis of SARS-CoV-2 by  FDA under an Emergency Use Authorization (EUA). This EUA will remain  in effect (meaning this test can be  used) for the duration of the  COVID-19 declaration under Section 564(b)(1) of the Act, 21 U.S.C.  section 360bbb-3(b)(1), unless the authorization is terminated or  revoked. Performed at [facility], [address], [place], [place] [address]   Culture, blood (routine x 2)     Status: None (Preliminary result)   Collection Time: 09/03/20  9:52 PM   Specimen: BLOOD LEFT ARM  Result Value Ref Range Status   Specimen Description BLOOD LEFT ARM  Final   Special Requests   Final    BOTTLES DRAWN AEROBIC AND ANAEROBIC Blood Culture adequate volume   Culture   Final    NO GROWTH 3 DAYS Performed at [facility], [address], [place], [place] [address]    Report Status PENDING  Incomplete  Culture, blood (routine x 2)     Status: None (Preliminary result)   Collection Time: 09/03/20 10:00 PM   Specimen: Left Antecubital; Blood  Result Value Ref Range Status   Specimen Description LEFT ANTECUBITAL  Final   Special Requests   Final    BOTTLES DRAWN AEROBIC AND ANAEROBIC Blood Culture adequate volume   Culture   Final    NO GROWTH 3 DAYS Performed at [facility], [address], [place], [place] [address]    Report Status PENDING  Incomplete     Labs: Basic Metabolic Panel: Recent Labs  Lab 09/03/20 1913 09/04/20 0447 09/05/20 0644  NA 132* 135 136  K 3.7 3.7 3.5  CL 94* 99 102  CO2 24 25 26   GLUCOSE 118* 115* 113*  BUN 10 10 12   CREATININE 1.08 1.01 1.00  CALCIUM 9.9 9.0 8.8*  MG  --  1.8  --   PHOS  --  3.6  --    Liver Function Tests: Recent Labs  Lab 09/04/20 0447  AST 20  ALT 14  ALKPHOS 37*  BILITOT 0.8  PROT 7.3  ALBUMIN 2.9*   No results for input(s): LIPASE, AMYLASE in the last 168 hours. No results for input(s): AMMONIA in the last 168 hours. CBC: Recent Labs  Lab 09/03/20 1913 09/04/20 0447 09/05/20 0644 09/06/20 0403  WBC 16.6* 20.2* 11.7* 9.7  NEUTROABS 14.5*  --   --   --   HGB 15.1 13.6 12.5* 12.6*  HCT 44.6 40.5 39.1 39.6  MCV 88.7  90.0 91.4 91.9  PLT 207 210 244 284   Cardiac Enzymes: No results for input(s): CKTOTAL, CKMB, CKMBINDEX, TROPONINI in the last 168 hours. BNP: BNP (last 3 results) Recent  Labs    04/19/20 1050  BNP 14.0    ProBNP (last 3 results) No results for input(s): PROBNP in the last 8760 hours.  CBG: Recent Labs  Lab 09/06/20 0756  GLUCAP 96       Signed:  Rhetta Mura MD   [facility] 09/06/2020, 9:54 AM

## 2020-09-08 LAB — CULTURE, BLOOD (ROUTINE X 2)
Culture: NO GROWTH
Culture: NO GROWTH
Special Requests: ADEQUATE
Special Requests: ADEQUATE

## 2020-09-08 MED FILL — !ELIQUIS 5MG TABLET: 5 | 30 days supply | Qty: 60 | Fill #0

## 2020-09-08 MED FILL — !ELIQUIS 5MG TABLET: 5 | 7 days supply | Qty: 28 | Fill #0

## 2021-06-25 IMAGING — CT CT ANGIO CHEST
2 of 6 series · 16 of 46 positions shown · IV contrast (Omnipaque or Isovue)
Comparison: CTA chest 04/19/2020.  Portable chest x-ray tonight.
COMPARISON: CTA chest 04/19/2020.  Portable chest x-ray tonight.

Addendum:
CLINICAL DATA: 48-year-old male with left lower extremity DVT and
PE diagnosed in [REDACTED], ran out of Eliquis recently. Increased pain
and numbness.

EXAM:
CT ANGIOGRAPHY CHEST WITH CONTRAST
TECHNIQUE: Multidetector CT imaging of the chest was performed using the
standard protocol during bolus administration of intravenous
contrast. Multiplanar CT image reconstructions and MIPs were
obtained to evaluate the vascular anatomy.
CONTRAST:  100mL OMNIPAQUE IOHEXOL 350 MG/ML SOLN

[Series 5: pe axial thins · axial · 0.61mm/px · z∈[+1041,+1308]mm · 13 of 293 slices shown]
[im 13/293  lung]
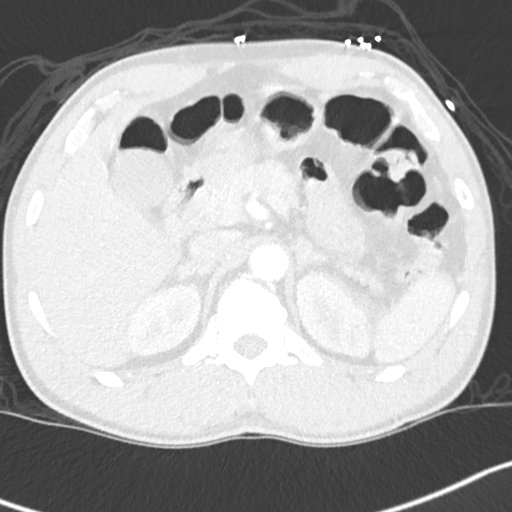
[im 39/293  soft-tissue]
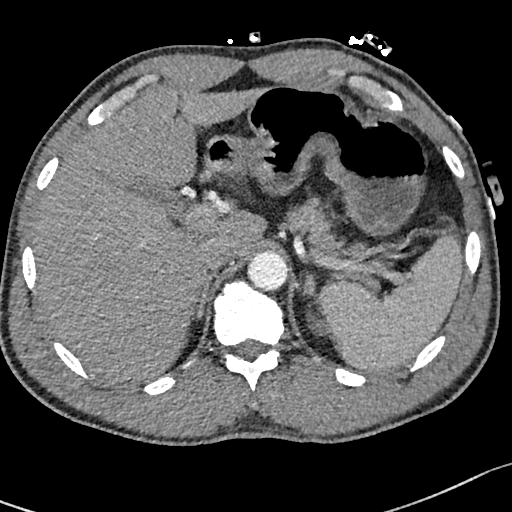
[im 64/293  lung]
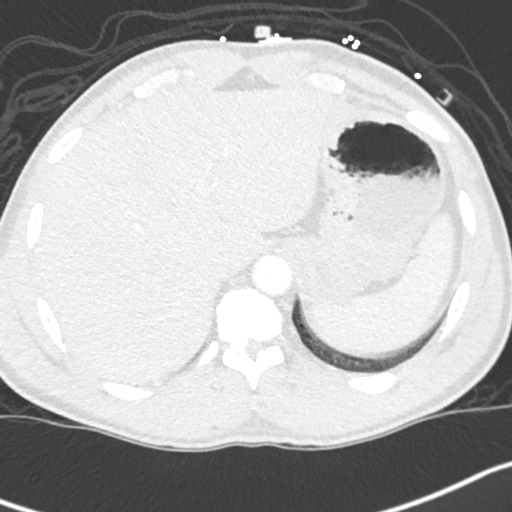
[im 77/293  soft-tissue]
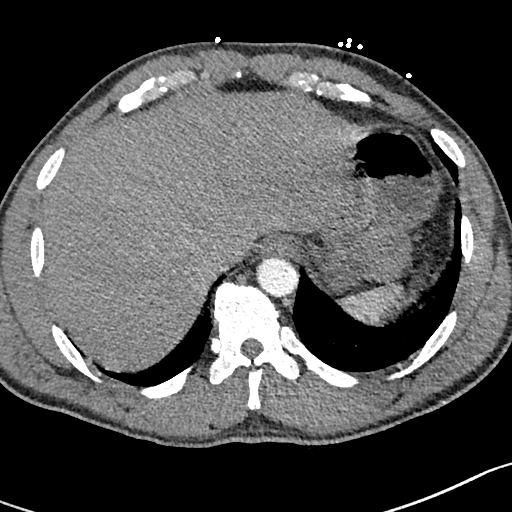
[im 102/293  lung]
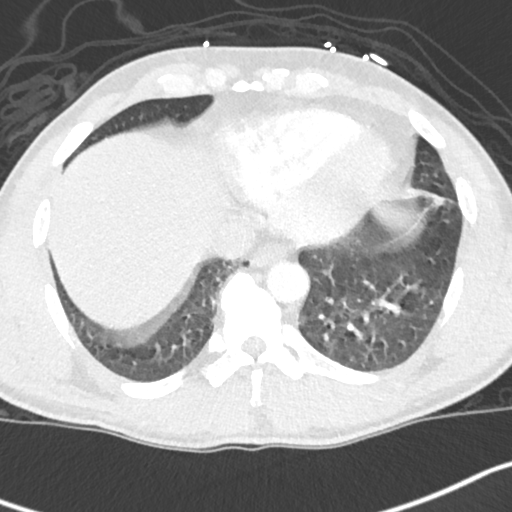
[im 127/293  soft-tissue]
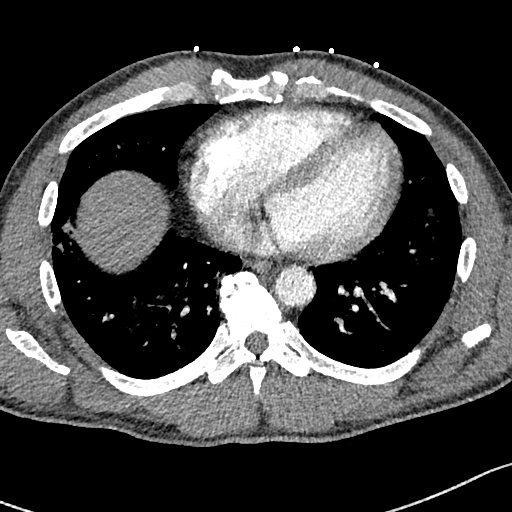
[im 153/293  lung]
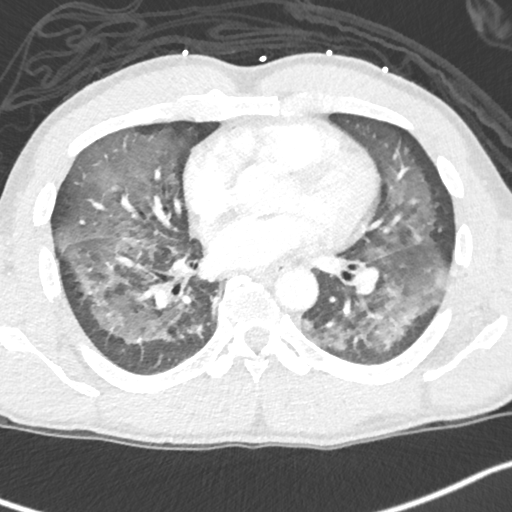
[im 166/293  soft-tissue]
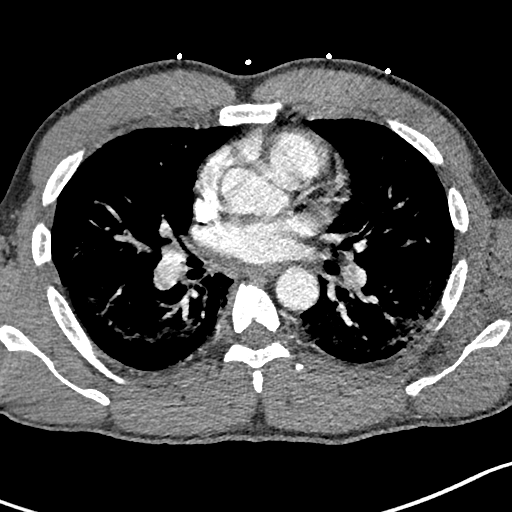
[im 191/293  lung]
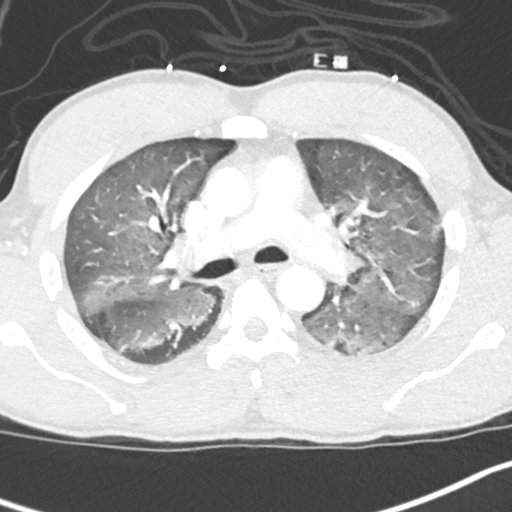
[im 216/293  soft-tissue]
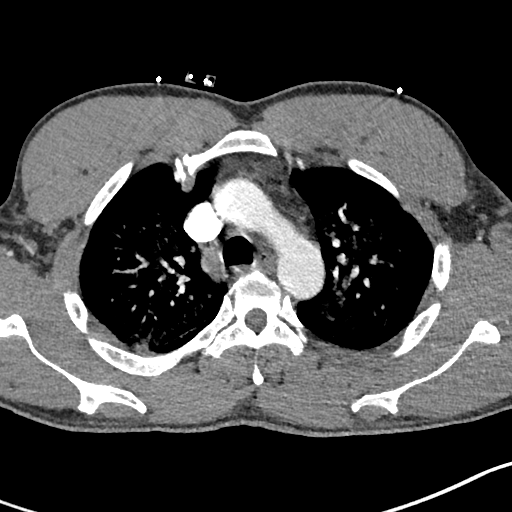
[im 229/293  lung]
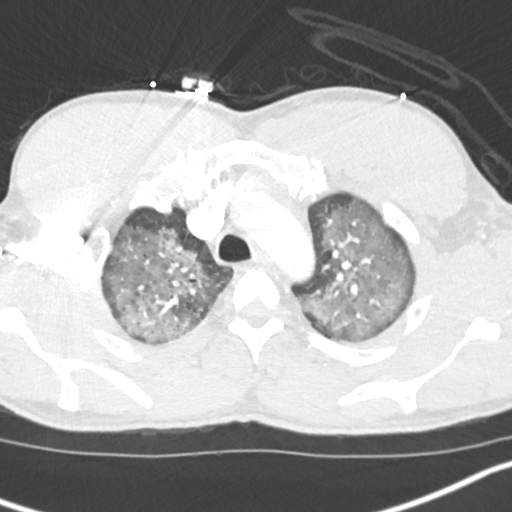
[im 254/293  soft-tissue]
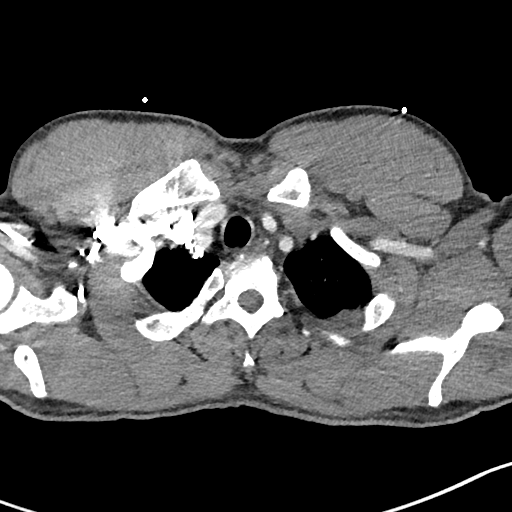
[im 280/293  lung]
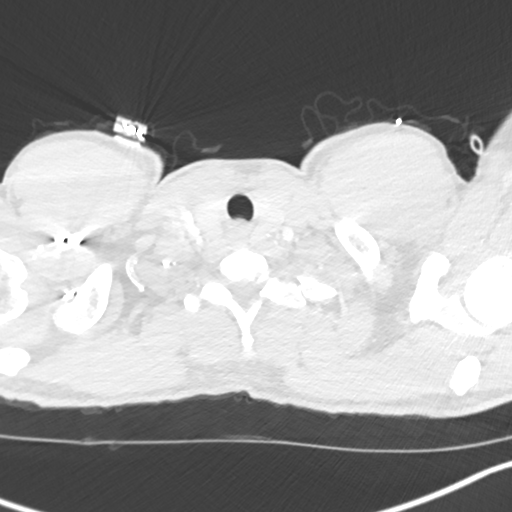

[Series 7: cor soft · coronal · 0.59mm/px · 3 of 126 slices shown]
[im 32/126  soft-tissue]
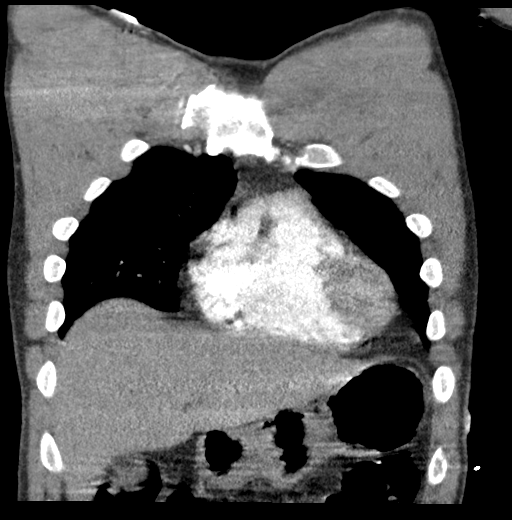
[im 63/126  soft-tissue]
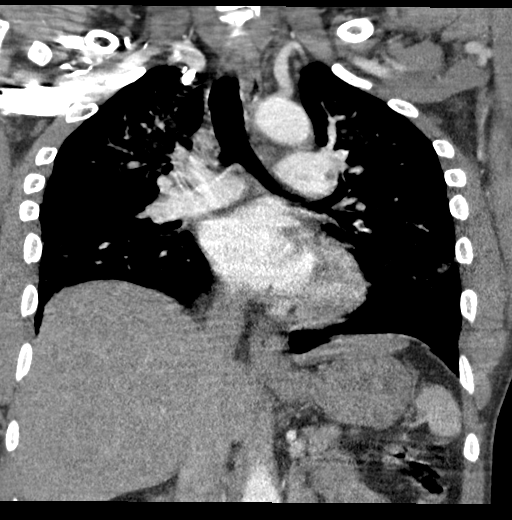
[im 94/126  soft-tissue]
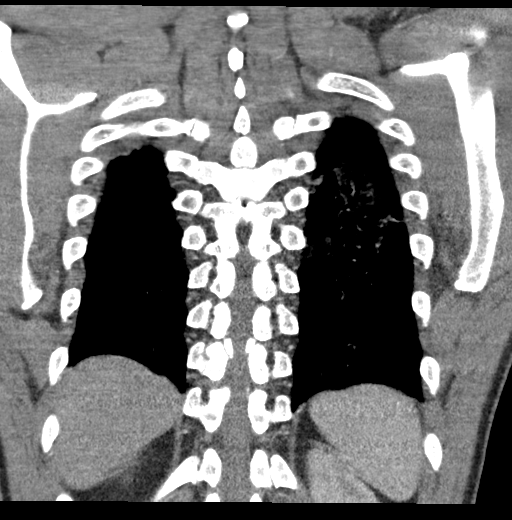

[16 of 46 positions shown; findings below may reference images not displayed]

FINDINGS: Cardiovascular: Suboptimal contrast bolus timing in the pulmonary
arterial tree. Improved bilateral pulmonary artery patency since
[REDACTED] although bilateral lobar and segmental pulmonary thrombus
persists (left lower lobe series 5, image 119, left upper lobe image
99, right lower lobe image 133. Much of this appears nonocclusive
now. There is no saddle embolus. Heart size is stable. No
pericardial effusion.

Negative visible aorta.

Mediastinum/Nodes: Mediastinal lymph nodes remain within normal
limits.

Lungs/Pleura: Major airways are patent and lung volumes are similar
to Fabier. Small pleural effusions have regressed.

However, there is new symmetric bilateral confluent pulmonary
ground-glass opacity with an unusual pattern sparing the subpleural
lung parenchyma bilaterally (series 6, image 45, 69). Symmetric
involvement of the superior lower lobe segments, but the lung bases
are relatively spared.

Upper Abdomen: Negative visible liver, gallbladder, spleen,
pancreas, adrenal glands, kidneys and bowel in the upper abdomen.

Musculoskeletal: Stable thoracic endplate degeneration. No acute
osseous abnormality identified.

Review of the MIP images confirms the above findings.
IMPRESSION: 1. Regressed but not resolved bilateral pulmonary emboli since [REDACTED].
Residual bilateral lobar and segmental thrombus which mostly appears
nonocclusive today.
This could be incompletely resolved clot versus recurrent Acute PE.

2. New symmetric and confluent pulmonary ground-glass opacity but
with an unusual pattern sparing of the subpleural lung and lung
bases bilaterally. No pleural effusion.
Differential considerations include viral and atypical respiratory
infection (including mycoplasma, adenovirus, influenza), but also
noninfectious inflammation such as pulmonary alveolar proteinosis,
hypersensitivity pneumonitis. Recommend excluding ENTOW-QP.

ADDENDUM:
Critical Value/emergent results were called by telephone at the time
of interpretation on 09/03/2020 at [DATE] to Dr. Egede in the
ED, who verbally acknowledged these results.

*** End of Addendum ***
FINDINGS: Cardiovascular: Suboptimal contrast bolus timing in the pulmonary
arterial tree. Improved bilateral pulmonary artery patency since
[REDACTED] although bilateral lobar and segmental pulmonary thrombus
persists (left lower lobe series 5, image 119, left upper lobe image
99, right lower lobe image 133. Much of this appears nonocclusive
now. There is no saddle embolus. Heart size is stable. No
pericardial effusion.

Negative visible aorta.

Mediastinum/Nodes: Mediastinal lymph nodes remain within normal
limits.

Lungs/Pleura: Major airways are patent and lung volumes are similar
to Fabier. Small pleural effusions have regressed.

However, there is new symmetric bilateral confluent pulmonary
ground-glass opacity with an unusual pattern sparing the subpleural
lung parenchyma bilaterally (series 6, image 45, 69). Symmetric
involvement of the superior lower lobe segments, but the lung bases
are relatively spared.

Upper Abdomen: Negative visible liver, gallbladder, spleen,
pancreas, adrenal glands, kidneys and bowel in the upper abdomen.

Musculoskeletal: Stable thoracic endplate degeneration. No acute
osseous abnormality identified.

Review of the MIP images confirms the above findings.
IMPRESSION: 1. Regressed but not resolved bilateral pulmonary emboli since [REDACTED].
Residual bilateral lobar and segmental thrombus which mostly appears
nonocclusive today.
This could be incompletely resolved clot versus recurrent Acute PE.

2. New symmetric and confluent pulmonary ground-glass opacity but
with an unusual pattern sparing of the subpleural lung and lung
bases bilaterally. No pleural effusion.
Differential considerations include viral and atypical respiratory
infection (including mycoplasma, adenovirus, influenza), but also
noninfectious inflammation such as pulmonary alveolar proteinosis,
hypersensitivity pneumonitis. Recommend excluding ENTOW-QP.

## 2023-06-16 DIAGNOSIS — F17210 Nicotine dependence, cigarettes, uncomplicated: Secondary | ICD-10-CM | POA: Diagnosis not present

## 2023-06-16 DIAGNOSIS — Z91018 Allergy to other foods: Secondary | ICD-10-CM | POA: Diagnosis not present

## 2023-06-16 DIAGNOSIS — M79605 Pain in left leg: Secondary | ICD-10-CM | POA: Diagnosis not present

## 2023-06-16 DIAGNOSIS — S82892S Other fracture of left lower leg, sequela: Secondary | ICD-10-CM | POA: Diagnosis not present

## 2023-06-16 DIAGNOSIS — Z91199 Patient's noncompliance with other medical treatment and regimen due to unspecified reason: Secondary | ICD-10-CM | POA: Diagnosis not present

## 2023-06-16 DIAGNOSIS — Z7901 Long term (current) use of anticoagulants: Secondary | ICD-10-CM | POA: Diagnosis not present

## 2023-06-16 DIAGNOSIS — Z86718 Personal history of other venous thrombosis and embolism: Secondary | ICD-10-CM | POA: Diagnosis not present

## 2023-06-16 DIAGNOSIS — X58XXXS Exposure to other specified factors, sequela: Secondary | ICD-10-CM | POA: Diagnosis not present

## 2023-08-13 DIAGNOSIS — Z7901 Long term (current) use of anticoagulants: Secondary | ICD-10-CM | POA: Diagnosis not present

## 2023-08-13 DIAGNOSIS — F17210 Nicotine dependence, cigarettes, uncomplicated: Secondary | ICD-10-CM | POA: Diagnosis not present

## 2023-08-13 DIAGNOSIS — Z86718 Personal history of other venous thrombosis and embolism: Secondary | ICD-10-CM | POA: Diagnosis not present

## 2023-08-13 DIAGNOSIS — M25562 Pain in left knee: Secondary | ICD-10-CM | POA: Diagnosis not present

## 2024-03-24 ENCOUNTER — Other Ambulatory Visit (HOSPITAL_BASED_OUTPATIENT_CLINIC_OR_DEPARTMENT_OTHER): Payer: Self-pay
